# Patient Record
Sex: Female | Born: 1935 | Hispanic: No | Smoking: Never smoker
Health system: Southern US, Community
[De-identification: ages and names within clinical notes are randomized; demographics above are authoritative.]

## PROBLEM LIST (undated history)

## (undated) DIAGNOSIS — G459 Transient cerebral ischemic attack, unspecified: Secondary | ICD-10-CM

## (undated) DIAGNOSIS — H409 Unspecified glaucoma: Secondary | ICD-10-CM

## (undated) DIAGNOSIS — I639 Cerebral infarction, unspecified: Secondary | ICD-10-CM

## (undated) DIAGNOSIS — E785 Hyperlipidemia, unspecified: Secondary | ICD-10-CM

## (undated) DIAGNOSIS — M1990 Unspecified osteoarthritis, unspecified site: Secondary | ICD-10-CM

## (undated) DIAGNOSIS — I10 Essential (primary) hypertension: Secondary | ICD-10-CM

## (undated) HISTORY — PX: EYE SURGERY: SHX253

---

## 1998-11-16 ENCOUNTER — Other Ambulatory Visit: Admission: RE | Admit: 1998-11-16 | Discharge: 1998-11-16 | Payer: Self-pay | Admitting: Ophthalmology

## 2015-10-16 DIAGNOSIS — M8949 Other hypertrophic osteoarthropathy, multiple sites: Secondary | ICD-10-CM

## 2015-10-16 DIAGNOSIS — I10 Essential (primary) hypertension: Secondary | ICD-10-CM | POA: Insufficient documentation

## 2015-10-16 DIAGNOSIS — I208 Other forms of angina pectoris: Secondary | ICD-10-CM | POA: Insufficient documentation

## 2015-10-16 DIAGNOSIS — M159 Polyosteoarthritis, unspecified: Secondary | ICD-10-CM

## 2015-10-16 DIAGNOSIS — M1990 Unspecified osteoarthritis, unspecified site: Secondary | ICD-10-CM | POA: Insufficient documentation

## 2015-10-16 DIAGNOSIS — M150 Primary generalized (osteo)arthritis: Secondary | ICD-10-CM | POA: Insufficient documentation

## 2015-10-16 DIAGNOSIS — E785 Hyperlipidemia, unspecified: Secondary | ICD-10-CM | POA: Insufficient documentation

## 2015-10-16 DIAGNOSIS — G459 Transient cerebral ischemic attack, unspecified: Secondary | ICD-10-CM | POA: Insufficient documentation

## 2015-10-16 DIAGNOSIS — I249 Acute ischemic heart disease, unspecified: Secondary | ICD-10-CM | POA: Insufficient documentation

## 2015-10-16 DIAGNOSIS — D696 Thrombocytopenia, unspecified: Secondary | ICD-10-CM | POA: Insufficient documentation

## 2015-10-16 DIAGNOSIS — G458 Other transient cerebral ischemic attacks and related syndromes: Secondary | ICD-10-CM

## 2015-10-17 NOTE — Progress Notes (Signed)
Patient ID: Adriana Stevens, female   DOB: 1925/10/19, 82 y.o.   MRN: [id number]     Cardiology Office Note   Date:  10/18/2015   ID:  Adriana Stevens, DOB 1925-12-24, MRN [id number]  PCP:  Vidal Schwalbe, MD  Cardiologist:   Jenkins Rouge, MD   No chief complaint on file.     History of Present Illness: Adriana Stevens is a 82 y.o. female who presents for evaluation of angina.  In fact she has had no chest pain for over a year. Reviewed notes from [facility] She had muscular chest Pain after moving boxes. She was offerred a stress test but declined. Long standing HTN on good Rx  Activity limited by arthritis in knees. She lives with her oldest son and daughter in  [place] who is with her an looks after her well. Compliant with meds No chest pain, palpitations, dyspnea or syncope. No previously documented CAD no echo or stress test in past    PMH:  Arthritis Knees  HTN:  Elevated Lipids  Chest Pain: muscular  Past Surgical History  Procedure Laterality Date  . Eye surgery N/A      Current Outpatient Prescriptions  Medication Sig Dispense Refill  . amLODipine (NORVASC) 10 MG tablet Take 10 mg by mouth daily.    Marland Kitchen aspirin EC 81 MG tablet Take 81 mg by mouth daily.    Marland Kitchen atorvastatin (LIPITOR) 20 MG tablet Take 20 mg by mouth daily at 6 PM.    . cholecalciferol (VITAMIN D) 1000 units tablet Take 1,000 Units by mouth daily.    . hydrochlorothiazide (HYDRODIURIL) 25 MG tablet Take 25 mg by mouth daily.    Marland Kitchen lisinopril (PRINIVIL,ZESTRIL) 40 MG tablet Take 40 mg by mouth daily.    Marland Kitchen loteprednol (LOTEMAX) 0.2 % SUSP 1 drop 4 (four) times daily.    . nitroGLYCERIN (NITROSTAT) 0.4 MG SL tablet Place 0.4 mg under the tongue every 5 (five) minutes as needed for chest pain.    Marland Kitchen thiamine (VITAMIN B-1) 100 MG tablet Take 100 mg by mouth daily.    . traMADol (ULTRAM) 50 MG tablet Take by mouth every 6 (six) hours as needed.    . traZODone (DESYREL) 50 MG tablet Take 50 mg by mouth at bedtime as  needed for sleep.    . vitamin C (ASCORBIC ACID) 500 MG tablet Take 500 mg by mouth daily.     No current facility-administered medications for this visit.    Allergies:   Review of patient's allergies indicates no known allergies.    Social History:  The patient  reports that she has never smoked. She has never used smokeless tobacco. She reports that she does not drink alcohol or use illicit drugs.   Family History:  The patient's family history includes Arthritis in her sister; Cancer in her brother; Heart attack in her mother; Hypertension in her brother and sister; Stroke in her father.    ROS:  Please see the history of present illness.   Otherwise, review of systems are positive for none.   All other systems are reviewed and negative.    PHYSICAL EXAM: VS:  BP 118/70 mmHg  Pulse 63  Ht 5\' 5"  (1.651 m)  Wt 66.679 kg (147 lb)  BMI 24.46 kg/m2  SpO2 97% , BMI Body mass index is 24.46 kg/(m^2). Affect appropriate Healthy:  appears stated age 61: normal Neck supple with no adenopathy JVP normal no bruits no thyromegaly Lungs clear with no  wheezing and good diaphragmatic motion Heart:  S1/S2 no murmur, no rub, gallop or click PMI normal Abdomen: benighn, BS positve, no tenderness, no AAA no bruit.  No HSM or HJR Distal pulses intact with no bruits No edema Neuro non-focal Skin warm and dry No muscular weakness    EKG:  10/2014  SR biphasic T waves lateral leads  10/18/15  SR rate 62 nonspecific ST/T wave changes   Recent Labs: No results found for requested labs within last 365 days.    Lipid Panel No results found for: CHOL, TRIG, HDL, CHOLHDL, VLDL, LDLCALC, LDLDIRECT    Wt Readings from Last 3 Encounters:  10/18/15 66.679 kg (147 lb)      Other studies Reviewed: Additional studies/ records that were reviewed today include: Notes [facility] an dECG Primary care notes [facility] Dr Bartolo Darter.    ASSESSMENT AND PLAN:  1. Chest pain:  Over a  year ago non recurrent by history muscular no indication for stress testing 2. HTN:  Well controlled.  Continue current medications and low sodium Dash type diet.   3. Osteorthritis:  F/u ortho walks with cane NSAI's and injections as needed 4. Lipids :  On statin labs with primary  5. ECG:  Nonspecific ST changes no evidence of old MI or ischemia some changes reflective of HTN   Current medicines are reviewed at length with the patient today.  The patient does not have concerns regarding medicines.  The following changes have been made:  no change  Labs/ tests ordered today include: None   Orders Placed This Encounter  Procedures  . EKG 12-Lead     Disposition:   FU with me PRN      Signed, Jenkins Rouge, MD  10/18/2015 9:18 AM    [facility] [place], [place], [place]  [address] Phone: [phone]; Fax: [phone]

## 2015-10-18 ENCOUNTER — Encounter: Payer: Self-pay | Admitting: Cardiovascular Disease

## 2015-10-18 ENCOUNTER — Ambulatory Visit (INDEPENDENT_AMBULATORY_CARE_PROVIDER_SITE_OTHER): Payer: Medicare Other | Admitting: Cardiovascular Disease

## 2015-10-18 VITALS — BP 118/70 | HR 63 | Ht 65.0 in | Wt 147.0 lb

## 2015-10-18 DIAGNOSIS — I10 Essential (primary) hypertension: Secondary | ICD-10-CM | POA: Diagnosis not present

## 2015-10-18 NOTE — Patient Instructions (Signed)
Your physician recommends that you schedule a follow-up appointment in: As Needed  Your physician recommends that you continue on your current medications as directed. Please refer to the Current Medication list given to you today.  Thank you for choosing [facility]!    

## 2020-05-11 ENCOUNTER — Other Ambulatory Visit (HOSPITAL_COMMUNITY): Payer: Self-pay | Admitting: Adult Health Nurse Practitioner

## 2020-05-16 ENCOUNTER — Other Ambulatory Visit (HOSPITAL_COMMUNITY): Payer: Self-pay | Admitting: Adult Health Nurse Practitioner

## 2020-05-17 ENCOUNTER — Other Ambulatory Visit (HOSPITAL_COMMUNITY): Payer: Self-pay | Admitting: Adult Health Nurse Practitioner

## 2020-05-17 DIAGNOSIS — N63 Unspecified lump in unspecified breast: Secondary | ICD-10-CM

## 2020-06-06 ENCOUNTER — Ambulatory Visit (HOSPITAL_COMMUNITY)
Admission: RE | Admit: 2020-06-06 | Discharge: 2020-06-06 | Disposition: A | Discharge status: Home / Self Care | Payer: Medicare Other | Source: Ambulatory Visit | Attending: Adult Health Nurse Practitioner | Admitting: Adult Health Nurse Practitioner

## 2020-06-06 ENCOUNTER — Other Ambulatory Visit (HOSPITAL_COMMUNITY): Payer: Self-pay | Admitting: Adult Health Nurse Practitioner

## 2020-06-06 ENCOUNTER — Encounter (HOSPITAL_COMMUNITY): Payer: Self-pay | Admitting: Radiology

## 2020-06-06 DIAGNOSIS — N6310 Unspecified lump in the right breast, unspecified quadrant: Secondary | ICD-10-CM | POA: Diagnosis present

## 2020-06-06 DIAGNOSIS — N63 Unspecified lump in unspecified breast: Secondary | ICD-10-CM

## 2020-06-06 DIAGNOSIS — R921 Mammographic calcification found on diagnostic imaging of breast: Secondary | ICD-10-CM | POA: Diagnosis not present

## 2020-06-06 DIAGNOSIS — R928 Other abnormal and inconclusive findings on diagnostic imaging of breast: Secondary | ICD-10-CM

## 2020-06-06 DIAGNOSIS — N6315 Unspecified lump in the right breast, overlapping quadrants: Secondary | ICD-10-CM | POA: Insufficient documentation

## 2020-06-20 ENCOUNTER — Ambulatory Visit (HOSPITAL_COMMUNITY)
Admission: RE | Admit: 2020-06-20 | Discharge: 2020-06-20 | Disposition: A | Discharge status: Home / Self Care | Payer: Medicare Other | Source: Ambulatory Visit | Attending: Adult Health Nurse Practitioner | Admitting: Adult Health Nurse Practitioner

## 2020-06-20 ENCOUNTER — Other Ambulatory Visit (HOSPITAL_COMMUNITY): Payer: Self-pay | Admitting: Adult Health Nurse Practitioner

## 2020-06-20 ENCOUNTER — Other Ambulatory Visit: Payer: Self-pay

## 2020-06-20 DIAGNOSIS — R928 Other abnormal and inconclusive findings on diagnostic imaging of breast: Secondary | ICD-10-CM

## 2020-06-20 MED ORDER — LIDOCAINE HCL (PF) 2 % IJ SOLN
INTRAMUSCULAR | Status: AC
  Filled 2020-06-20: qty 10

## 2020-06-20 MED ORDER — LIDOCAINE-EPINEPHRINE (PF) 1 %-1:200000 IJ SOLN
INTRAMUSCULAR | Status: AC
  Filled 2020-06-20: qty 30

## 2020-06-20 MED ORDER — SODIUM BICARBONATE 4.2 % IV SOLN
INTRAVENOUS | Status: AC
  Filled 2020-06-20: qty 10

## 2020-06-22 LAB — SURGICAL PATHOLOGY

## 2020-06-28 ENCOUNTER — Telehealth: Payer: Self-pay | Admitting: Family Medicine

## 2020-06-28 ENCOUNTER — Encounter: Payer: Self-pay | Admitting: Family Medicine

## 2020-06-28 NOTE — Progress Notes (Signed)
This encounter was created in error - please disregard.

## 2020-06-28 NOTE — Telephone Encounter (Signed)
[place] and spoke to [place], [employer], she states to hold on she will go as the pt - Alisa cam back on phone and states that the pt stated to her that she did not want surgery at this time. Will send this note to inform our mammogram department of pt decision.

## 2020-07-31 ENCOUNTER — Encounter (HOSPITAL_COMMUNITY): Payer: Self-pay

## 2020-07-31 NOTE — Progress Notes (Signed)
Unable to complete introductory phone call as patient is a resident of [facility]. I will plan to meet with the patient during her initial visit with Dr. Delton Coombes

## 2020-08-01 ENCOUNTER — Inpatient Hospital Stay (HOSPITAL_COMMUNITY): Payer: Medicare Other | Attending: Hematology | Admitting: Hematology

## 2020-08-01 ENCOUNTER — Encounter (HOSPITAL_COMMUNITY): Payer: Self-pay

## 2020-08-01 ENCOUNTER — Other Ambulatory Visit: Payer: Self-pay

## 2020-08-01 ENCOUNTER — Encounter (HOSPITAL_COMMUNITY): Payer: Self-pay | Admitting: Hematology

## 2020-08-01 VITALS — BP 168/95 | HR 77 | Temp 96.9°F | Resp 16 | Ht 66.0 in | Wt 150.6 lb

## 2020-08-01 DIAGNOSIS — C50811 Malignant neoplasm of overlapping sites of right female breast: Secondary | ICD-10-CM

## 2020-08-01 DIAGNOSIS — Z800 Family history of malignant neoplasm of digestive organs: Secondary | ICD-10-CM | POA: Diagnosis not present

## 2020-08-01 DIAGNOSIS — C50911 Malignant neoplasm of unspecified site of right female breast: Secondary | ICD-10-CM | POA: Insufficient documentation

## 2020-08-01 DIAGNOSIS — Z803 Family history of malignant neoplasm of breast: Secondary | ICD-10-CM | POA: Diagnosis not present

## 2020-08-01 DIAGNOSIS — Z8049 Family history of malignant neoplasm of other genital organs: Secondary | ICD-10-CM | POA: Diagnosis not present

## 2020-08-01 DIAGNOSIS — Z79811 Long term (current) use of aromatase inhibitors: Secondary | ICD-10-CM | POA: Diagnosis not present

## 2020-08-01 DIAGNOSIS — Z170 Estrogen receptor positive status [ER+]: Secondary | ICD-10-CM | POA: Diagnosis not present

## 2020-08-01 DIAGNOSIS — C50211 Malignant neoplasm of upper-inner quadrant of right female breast: Secondary | ICD-10-CM

## 2020-08-01 MED ORDER — ANASTROZOLE 1 MG PO TABS: 1.0000 mg | ORAL_TABLET | Freq: Every day | ORAL | 6 refills | Status: DC

## 2020-08-01 NOTE — Progress Notes (Signed)
I met with the patient and her daughter today during the patient's initial visit with Dr. Delton Coombes. I introduced myself and explained my role in the patient's care. I provided my contact information and encouraged the patient or family to call with any questions or concerns.

## 2020-08-01 NOTE — Progress Notes (Signed)
[facility] [address] [place] [address]   Patient Care Team: Vidal Schwalbe, MD as PCP - General (Family Medicine) Brien Mates, RN as Oncology Nurse Navigator (Oncology)  CHIEF COMPLAINTS/PURPOSE OF CONSULTATION:  Newly diagnosed right breast cancer  HISTORY OF PRESENTING ILLNESS:  Adriana Stevens 87 y.o. female is here because of recent diagnosis of right breast cancer, at the request of Dr. Nelia Shi from [facility].  Today she is accompanied by her daughter and she reports feeling okay. She reports that the masses were discovered on mammogram. She is not able to ambulate and needs to use a wheelchair due to leg weakness after a fall years ago.  She lives in [place]. She needs help to dress herself, though she is able to feed herself, bath herself once she transfers into the shower, and is able to transfer herself from the wheelchair over to the commode in the bathroom. 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 She is a non-smoker. Her twin sister had cancer; another sister had breast cancer; a brother had some kind of cancer.  She does not want to pursue surgery and instead with do medical treatment. She declines to have genetic testing done.  I reviewed her records extensively and collaborated the history with the patient.  SUMMARY OF ONCOLOGIC HISTORY: Oncology History   No history exists.    In terms of breast cancer risk profile:  She menarched at early age of 50 and went to menopause at age mid-38's.  She had 6 pregnancies, her first child was born at age 72.  She never received birth control pills.  She was never exposed to fertility medications or hormone replacement therapy.  She has positive family history of Breast/GYN/GI cancer.  MEDICAL HISTORY:  No past medical history on file.  SURGICAL HISTORY: ** The histories are not reviewed yet. Please review them in the "History" navigator section  and refresh this [place].  SOCIAL HISTORY: Social History   Socioeconomic History  . Marital status: Unknown    Spouse name: Not on file  . Number of children: Not on file  . Years of education: Not on file  . Highest education level: Not on file  Occupational History  . Not on file  Tobacco Use  . Smoking status: Never Smoker  . Smokeless tobacco: Never Used  Substance and Sexual Activity  . Alcohol use: No    Alcohol/week: 0.0 standard drinks  . Drug use: No  . Sexual activity: Not on file  Other Topics Concern  . Not on file  Social History Narrative  . Not on file   Social Determinants of Health   Financial Resource Strain: Low Risk   . Difficulty of Paying Living Expenses: Not hard at all  Food Insecurity: No Food Insecurity  . Worried About Charity fundraiser in the Last Year: Never true  . Ran Out of Food in the Last Year: Never true  Transportation Needs: No Transportation Needs  . Lack of Transportation (Medical): No  . Lack of Transportation (Non-Medical): No  Physical Activity: Inactive  . Days of Exercise per Week: 0 days  . Minutes of Exercise per Session: 0 min  Stress: No Stress Concern Present  . Feeling of Stress : Not at all  Social Connections: Moderately Isolated  . Frequency of Communication with Friends and Family: More than three times a week  . Frequency of Social Gatherings  with Friends and Family: More than three times a week  . Attends Religious Services: 1 to 4 times per year  . Active Member of Clubs or Organizations: No  . Attends Archivist Meetings: Never  . Marital Status: Widowed  Intimate Partner Violence: Not At Risk  . Fear of Current or Ex-Partner: No  . Emotionally Abused: No  . Physically Abused: No  . Sexually Abused: No    FAMILY HISTORY: Family History  Problem Relation Age of Onset  . Heart attack Mother   . Stroke Father   . Arthritis Sister   . Hypertension Sister   . Cancer Brother   .  Hypertension Brother     ALLERGIES:  has No Known Allergies.  MEDICATIONS:  Current Outpatient Medications  Medication Sig Dispense Refill  . amLODipine (NORVASC) 10 MG tablet Take 10 mg by mouth daily.    Marland Kitchen anastrozole (ARIMIDEX) 1 MG tablet Take 1 tablet (1 mg total) by mouth daily. 30 tablet 6  . Ascorbic Acid 500 MG/15ML LIQD 1 tablet    . aspirin EC 81 MG tablet Take 81 mg by mouth daily.    Marland Kitchen atorvastatin (LIPITOR) 20 MG tablet Take 20 mg by mouth daily at 6 PM.    . cholecalciferol (VITAMIN D) 1000 units tablet Take 1,000 Units by mouth daily.    . diclofenac Sodium (VOLTAREN) 1 % GEL See admin instructions.    . famotidine (PEPCID) 20 MG tablet Take 20 mg by mouth 2 (two) times daily.    . hydrochlorothiazide (HYDRODIURIL) 25 MG tablet Take 25 mg by mouth daily.    Marland Kitchen latanoprost (XALATAN) 0.005 % ophthalmic solution SMARTSIG:In Eye(s)    . lisinopril (PRINIVIL,ZESTRIL) 40 MG tablet Take 40 mg by mouth daily.    Marland Kitchen loteprednol (LOTEMAX) 0.2 % SUSP 1 drop 4 (four) times daily.    . nitroGLYCERIN (NITROSTAT) 0.4 MG SL tablet Place 0.4 mg under the tongue every 5 (five) minutes as needed for chest pain.    Marland Kitchen thiamine (VITAMIN B-1) 100 MG tablet Take 100 mg by mouth daily.    . vitamin C (ASCORBIC ACID) 500 MG tablet Take 500 mg by mouth daily.    . traMADol (ULTRAM) 50 MG tablet Take by mouth every 6 (six) hours as needed. (Patient not taking: Reported on 08/01/2020)    . traZODone (DESYREL) 50 MG tablet Take 50 mg by mouth at bedtime as needed for sleep. (Patient not taking: Reported on 08/01/2020)     No current facility-administered medications for this visit.    REVIEW OF SYSTEMS:   Review of Systems  Constitutional: Positive for appetite change (75%) and fatigue (75%).  Musculoskeletal: Positive for arthralgias (8/10 L knee pain).  Neurological: Positive for numbness (R hand).  All other systems reviewed and are negative.   PHYSICAL EXAMINATION: ECOG PERFORMANCE STATUS: 3  - Symptomatic, >50% confined to bed  Vitals:   08/01/20 0822  BP: (!) 168/95  Pulse: 77  Resp: 16  Temp: (!) 96.9 F (36.1 C)  SpO2: 98%   Filed Weights   08/01/20 0822  Weight: 150 lb 9.6 oz (68.3 kg)   Physical Exam Vitals reviewed.  Constitutional:      Appearance: Normal appearance.     Comments: In wheelchair  Cardiovascular:     Rate and Rhythm: Normal rate and regular rhythm.     Pulses: Normal pulses.     Heart sounds: Normal heart sounds.  Pulmonary:     Effort: Pulmonary  effort is normal.     Breath sounds: Normal breath sounds.  Chest:  Breasts:     Right: Mass (2.5 cm freely mobile in 12 o'clock) present. No swelling, bleeding, inverted nipple, nipple discharge, skin change or tenderness.    Abdominal:     Palpations: Abdomen is soft. There is no mass.     Tenderness: There is no abdominal tenderness.  Musculoskeletal:     Right lower leg: No edema.     Left lower leg: No edema.  Neurological:     General: No focal deficit present.     Mental Status: She is alert and oriented to person, place, and time.  Psychiatric:        Mood and Affect: Mood normal.        Behavior: Behavior normal.      LABORATORY DATA:  I have reviewed the data as listed Recent Results (from the past 2160 hour(s))  Surgical pathology     Status: None   Collection Time: 06/20/20  8:44 AM  Result Value Ref Range   SURGICAL PATHOLOGY      SURGICAL PATHOLOGY ** THIS IS AN ADDENDUM REPORT ** CASE: [id number] PATIENT: Zara Chess Surgical Pathology Report **Addendum **  Reason for Addendum #1:  Breast Biomarker Results  Clinical History: mass   FINAL MICROSCOPIC DIAGNOSIS:  A. BREAST, 12:00, RIGHT, BIOPSY: - Invasive 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 calcifications. - See comment.  B. BREAST, 2:30, RIGHT, BIOPSY: - High-grade ductal carcinoma in situ with focal necrosis and calcifications. - Microscopic focus suspicious for invasive  ductal carcinoma. - See comment.  COMMENT: The greatest tumor dimension of invasive carcinoma in a single core is 0.9 cm. Breast prognostic profile will be performed.  Dr. Melina Copa agrees.  Call to the breast center of [facility] on 06/21/2020.  GROSS DESCRIPTION: Specimen A: In formalin labeled H[place]berd, Katrine and right breast 12:00 (TIF 0834 hours and CIT < than 1 minute) are 3 cores of gray-white to yellow,  soft to firm tissue which range from 0.9 x 0.2 x 0.2 cm to 1.5 x 0.2 x 0.2 cm, in toto 1 block.  Specimen B: In formalin labeled Pommier, Brynne and right breast 2:30 (TIF 0837 hours and CIT < than 1 minute) are 2 cores of gray-white to yellow, soft to firm tissue, 0.7 x 0.15 x 0.15 cm and 1.1 x 0.2 x 0.2 cm, in toto 1 block. SW 06/20/2020  Final Diagnosis performed by Claudette Laws, MD.   Electronically signed 06/21/2020 Technical component performed at [facility], [place] [address] [place], [place] [address].  Professional component performed at [employer]. [facility], [place] [address] [place], [place] [address].  Immunohistochemistry Technical component (if applicable) was performed at [facility]. [address] [place], [place], [place] [address].   IMMUNOHISTOCHEMISTRY DISCLAIMER (if applicable): Some of these immunohistochemical stains may have been developed and the performance characteristics determine by [facility]. Some may not have been cleared or approved by the U.S. Food and Drug Administration. The FDA has determined that such clearance or approval is not necessary. This test is used for clinical purposes. It should not be regarded as investigational or for research. This laboratory is certified under the [place] (CLIA-88) as qualified to perform high complexity clinical laboratory testing.  The controls stained appropriately.  ADDENDUM:  PROGNOSTIC  INDICATOR RESULTS:  Immunohistochemical and morphometric analysis performed manually  The tumor  cells are NEGATIVE for Her2 (0).  Estrogen Receptor: POSITIVE, 100% STRONG STAINING INTENSITY Progesterone Receptor: POSITIVE, 80% STRONG-MODERATE STAINING INTENSITY  Proliferation Marker Ki-67: 5%  Reference Range Estrogen and Progesterone Receptor      Negative  0%      Positive  >1%  All controls stained appropriately.    Addendum #1 performed by Thressa Sheller, MD.   Electronically signed 06/22/2020 Technical component performed at [facility], [place] [address] [place], [place] [address].  Professional component performed at [employer]. [facility], [place] [address] [place], [place] [address].  Immunohistochemistry Technical component (if applicable) was performed at [facility]. [address] [place], [place], [place] [address].   IMMUNOHISTOCHEMISTRY DISCLAIMER (if applicable): Some of these immunohistochemical stains may have been developed and the performance characteristics determine by [facility]. Some may not have been cleared or approved by the U.S. Food and Drug Administration. The FDA has determined that such clearance or approval is not necessary. This test is used for clinical purposes. It should not be regarded as investigational or for research. This laboratory is certified under the [place] (CLIA-88) as q ualified to perform high complexity clinical laboratory testing.  The controls stained appropriately.     RADIOGRAPHIC STUDIES: I have personally reviewed the radiological reports and agreed with the findings in the report. No results found.   ASSESSMENT:  1.  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  Additional  suspicious mass with calcifications in the inner right breast measuring up to 2.3 cm. 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 -She does not want to have any surgical intervention.  2.  Social/family history: -She lives at assisted living facility [facility]".  She uses wheelchair for ambulation and is able to dress herself and feed herself. 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  Never smoker. -Twin sister had cancer.  Brother had cancer.  Another sister had breast cancer.    PLAN:  1.  Stage I (T2 N0 M0) right breast IDC, ER/PR positive, HER-2 negative: -She does not want to have any kind of surgical intervention. -We talked about starting her on antiestrogen therapy with anastrozole with periodic monitoring. -We discussed the side effects of anastrozole in detail.  She agrees with proceeding with the treatment. -We have sent a prescription for anastrozole today. -We talked about her family history and the need for genetic testing.  She has 1 living daughter who is accompanying her today.  She declined any genetic testing at this time. -RTC 1 month for follow-up.  Will consider physical exam and reimaging in 3 months.  2.  Bone health: -Recommend taking calcium and vitamin D supplements. -Recommend baseline DEXA scan and checking vitamin D levels.  All questions were answered. The patient knows to call the clinic with any problems, questions or concerns.   Derek Jack, MD 08/01/20 9:47 AM  [place] [phone]   I, Milinda Antis, am acting as a scribe for Dr. Sanda Linger.  I, Derek Jack MD, have reviewed the above documentation for accuracy and completeness, and I agree with the above.

## 2020-08-01 NOTE — Patient Instructions (Addendum)
[place] at [facility] Discharge Instructions  You were seen today by Dr. Delton Coombes. He went over your recent results. Given that your breast cancer responds to estrogen, you will be prescribed Arimidex 1 mg to take once daily for 5 years to control the breast cancer. You will also be scheduled to have a DEXA scan to check the strength of your bones. Dr. Delton Coombes will see you back in 1 month for labs and follow up.   Thank you for choosing [place] at [facility] to provide your oncology and hematology care.  To afford each patient quality time with our provider, please arrive at least 15 minutes before your scheduled appointment time.   If you have a lab appointment with the [facility] please come in thru the Main Entrance and check in at the main information desk  You need to re-schedule your appointment should you arrive 10 or more minutes late.  We strive to give you quality time with our providers, and arriving late affects you and other patients whose appointments are after yours.  Also, if you no show three or more times for appointments you may be dismissed from the clinic at the providers discretion.     Again, thank you for choosing [facility].  Our hope is that these requests will decrease the amount of time that you wait before being seen by our physicians.       _____________________________________________________________  Should you have questions after your visit to [facility], please contact our office at [phone] between the hours of 8:00 a.m. and 4:30 p.m.  Voicemails left after 4:00 p.m. will not be returned until the following business day.  For prescription refill requests, have your pharmacy contact our office and allow 72 hours.    Cancer Center Support Programs:   > Cancer Support Group  2nd Tuesday of the month 1pm-2pm, Journey Room

## 2020-08-23 ENCOUNTER — Other Ambulatory Visit (HOSPITAL_COMMUNITY): Payer: Self-pay | Admitting: *Deleted

## 2020-08-29 ENCOUNTER — Telehealth (HOSPITAL_COMMUNITY): Payer: Self-pay

## 2020-08-29 ENCOUNTER — Encounter (HOSPITAL_COMMUNITY): Payer: Self-pay

## 2020-08-29 NOTE — Telephone Encounter (Signed)
This nurse received a call from [name] stating she anted to verify appointment for this patient.  This nurse advised of appointment times.  Also made aware of Dexa Scan that was scheduled for 08/31/20 at 10:30 and she was not to take any Calcium 24-48 hours prior.  She asked this nurse to call [facility] to remind them as well.  This nurse made a call and spoke with Alwyn Ren, the nurse providing care to this patient and reminded her to hold the Calcium and appointment times.  She acknowledged understanding.  No further questions or concerns at this time.

## 2020-08-31 ENCOUNTER — Other Ambulatory Visit: Payer: Self-pay

## 2020-08-31 ENCOUNTER — Inpatient Hospital Stay (HOSPITAL_BASED_OUTPATIENT_CLINIC_OR_DEPARTMENT_OTHER): Payer: Medicare Other | Admitting: Hematology

## 2020-08-31 ENCOUNTER — Inpatient Hospital Stay (HOSPITAL_COMMUNITY): Payer: Medicare Other | Attending: Hematology

## 2020-08-31 ENCOUNTER — Ambulatory Visit (HOSPITAL_COMMUNITY)
Admission: RE | Admit: 2020-08-31 | Discharge: 2020-08-31 | Disposition: A | Discharge status: Home / Self Care | Payer: Medicare Other | Source: Ambulatory Visit | Attending: Hematology | Admitting: Hematology

## 2020-08-31 VITALS — BP 172/88 | HR 75 | Temp 97.0°F | Resp 18 | Wt 165.8 lb

## 2020-08-31 DIAGNOSIS — Z800 Family history of malignant neoplasm of digestive organs: Secondary | ICD-10-CM | POA: Diagnosis not present

## 2020-08-31 DIAGNOSIS — Z8049 Family history of malignant neoplasm of other genital organs: Secondary | ICD-10-CM | POA: Insufficient documentation

## 2020-08-31 DIAGNOSIS — Z780 Asymptomatic menopausal state: Secondary | ICD-10-CM | POA: Diagnosis not present

## 2020-08-31 DIAGNOSIS — Z170 Estrogen receptor positive status [ER+]: Secondary | ICD-10-CM

## 2020-08-31 DIAGNOSIS — Z79811 Long term (current) use of aromatase inhibitors: Secondary | ICD-10-CM | POA: Diagnosis not present

## 2020-08-31 DIAGNOSIS — Z803 Family history of malignant neoplasm of breast: Secondary | ICD-10-CM | POA: Diagnosis not present

## 2020-08-31 DIAGNOSIS — M8589 Other specified disorders of bone density and structure, multiple sites: Secondary | ICD-10-CM | POA: Diagnosis not present

## 2020-08-31 DIAGNOSIS — Z13820 Encounter for screening for osteoporosis: Secondary | ICD-10-CM | POA: Insufficient documentation

## 2020-08-31 DIAGNOSIS — C50211 Malignant neoplasm of upper-inner quadrant of right female breast: Secondary | ICD-10-CM | POA: Insufficient documentation

## 2020-08-31 DIAGNOSIS — C50811 Malignant neoplasm of overlapping sites of right female breast: Secondary | ICD-10-CM | POA: Insufficient documentation

## 2020-08-31 LAB — CBC WITH DIFFERENTIAL/PLATELET
Abs Immature Granulocytes: 0.01 10*3/uL (ref 0.00–0.07)
Basophils Absolute: 0 10*3/uL (ref 0.0–0.1)
Basophils Relative: 0 %
Eosinophils Absolute: 0 10*3/uL (ref 0.0–0.5)
Eosinophils Relative: 1 %
HCT: 42.7 % (ref 36.0–46.0)
Hemoglobin: 13.9 g/dL (ref 12.0–15.0)
Immature Granulocytes: 0 %
Lymphocytes Relative: 31 %
Lymphs Abs: 2.1 10*3/uL (ref 0.7–4.0)
MCH: 31.6 pg (ref 26.0–34.0)
MCHC: 32.6 g/dL (ref 30.0–36.0)
MCV: 97 fL (ref 80.0–100.0)
Monocytes Absolute: 0.5 10*3/uL (ref 0.1–1.0)
Monocytes Relative: 7 %
Neutro Abs: 4 10*3/uL (ref 1.7–7.7)
Neutrophils Relative %: 61 %
Platelets: 195 10*3/uL (ref 150–400)
RBC: 4.4 MIL/uL (ref 3.87–5.11)
RDW: 13 % (ref 11.5–15.5)
WBC: 6.6 10*3/uL (ref 4.0–10.5)
nRBC: 0 % (ref 0.0–0.2)

## 2020-08-31 LAB — COMPREHENSIVE METABOLIC PANEL
ALT: 16 U/L (ref 0–44)
AST: 22 U/L (ref 15–41)
Albumin: 3.7 g/dL (ref 3.5–5.0)
Alkaline Phosphatase: 63 U/L (ref 38–126)
Anion gap: 7 (ref 5–15)
BUN: 14 mg/dL (ref 8–23)
CO2: 30 mmol/L (ref 22–32)
Calcium: 9.5 mg/dL (ref 8.9–10.3)
Chloride: 102 mmol/L (ref 98–111)
Creatinine, Ser: 0.88 mg/dL (ref 0.44–1.00)
GFR, Estimated: >60 mL/min (ref >60)
Glucose, Bld: 110 mg/dL — ABNORMAL HIGH (ref 70–99)
Potassium: 3.6 mmol/L (ref 3.5–5.1)
Sodium: 139 mmol/L (ref 135–145)
Total Bilirubin: 0.6 mg/dL (ref 0.3–1.2)
Total Protein: 7.1 g/dL (ref 6.5–8.1)

## 2020-08-31 LAB — VITAMIN D 25 HYDROXY (VIT D DEFICIENCY, FRACTURES): Vit D, 25-Hydroxy: 33.93 ng/mL (ref 30–100)

## 2020-08-31 NOTE — Progress Notes (Signed)
[facility] [address] [place] [address]   Patient Care Team: Vidal Schwalbe, MD as PCP - General (Family Medicine) Brien Mates, RN as Oncology Nurse Navigator (Oncology)  SUMMARY OF ONCOLOGIC HISTORY: Oncology History   No history exists.    CHIEF COMPLIANT: Follow-up for right breast cancer   INTERVAL HISTORY: Adriana Stevens is a 87 y.o. female here today for follow up of her right breast cancer. Her last visit was on 08/01/2020.   Today she is accompanied by her daughter and she reports feeling okay. She is taking Arimidex daily and denies having any breast pain, hot flashes or new bone aches. She continues having chronic hip pain. She continues taking calcium and vitamin D.   REVIEW OF SYSTEMS:   Review of Systems  Constitutional: Positive for fatigue (50%). Negative for appetite change.  Cardiovascular: Negative for chest pain.  Endocrine: Negative for hot flashes.  Musculoskeletal: Positive for arthralgias (4/10 hip pain).  All other systems reviewed and are negative.   I have reviewed the past medical history, past surgical history, social history and family history with the patient and they are unchanged from previous note.   ALLERGIES:   has No Known Allergies.   MEDICATIONS:  Current Outpatient Medications  Medication Sig Dispense Refill  . amLODipine (NORVASC) 10 MG tablet Take 10 mg by mouth daily.    Marland Kitchen anastrozole (ARIMIDEX) 1 MG tablet Take 1 tablet (1 mg total) by mouth daily. 30 tablet 6  . Ascorbic Acid 500 MG/15ML LIQD 1 tablet    . aspirin EC 81 MG tablet Take 81 mg by mouth daily.    Marland Kitchen atorvastatin (LIPITOR) 20 MG tablet Take 20 mg by mouth daily at 6 PM.    . cholecalciferol (VITAMIN D) 1000 units tablet Take 1,000 Units by mouth daily.    . diclofenac Sodium (VOLTAREN) 1 % GEL See admin instructions.    . famotidine (PEPCID) 20 MG tablet Take 20 mg by mouth 2 (two) times daily.    . hydrochlorothiazide  (HYDRODIURIL) 25 MG tablet Take 25 mg by mouth daily.    Marland Kitchen latanoprost (XALATAN) 0.005 % ophthalmic solution SMARTSIG:In Eye(s)    . lisinopril (PRINIVIL,ZESTRIL) 40 MG tablet Take 40 mg by mouth daily.    Marland Kitchen loteprednol (LOTEMAX) 0.5 % ophthalmic suspension SMARTSIG:In Eye(s)    . nitroGLYCERIN (NITROSTAT) 0.4 MG SL tablet Place 0.4 mg under the tongue every 5 (five) minutes as needed for chest pain.    Marland Kitchen olmesartan-hydrochlorothiazide (BENICAR HCT) 40-12.5 MG tablet Take 1 tablet by mouth daily.    . potassium chloride SA (KLOR-CON) 20 MEQ tablet Take 20 mEq by mouth daily.    Marland Kitchen thiamine (VITAMIN B-1) 100 MG tablet Take 100 mg by mouth daily.    . traMADol (ULTRAM) 50 MG tablet Take by mouth every 6 (six) hours as needed.    . traZODone (DESYREL) 50 MG tablet Take 50 mg by mouth at bedtime as needed for sleep.    . vitamin C (ASCORBIC ACID) 500 MG tablet Take 500 mg by mouth daily.     No current facility-administered medications for this visit.     PHYSICAL EXAMINATION: Performance status (ECOG): 3 - Symptomatic, >50% confined to bed  Vitals:   08/31/20 1304  BP: (!) 172/88  Pulse: 75  Resp: 18  Temp: (!) 97 F (36.1 C)  SpO2: 98%   Wt Readings from Last 3 Encounters:  08/31/20 165 lb 12.8 oz (  75.2 kg)  08/01/20 150 lb 9.6 oz (68.3 kg)  10/18/15 147 lb (66.7 kg)   Physical Exam Vitals reviewed.  Constitutional:      Appearance: Normal appearance.  Chest:  Breasts:     Right: Mass (1.5 cm @ 12 o'clock) present. No swelling, bleeding, inverted nipple, nipple discharge, skin change, tenderness, axillary adenopathy or supraclavicular adenopathy.     Left: No axillary adenopathy or supraclavicular adenopathy.    Lymphadenopathy:     Upper Body:     Right upper body: No supraclavicular, axillary or pectoral adenopathy.     Left upper body: No supraclavicular, axillary or pectoral adenopathy.  Neurological:     General: No focal deficit present.     Mental Status: She is  alert and oriented to person, place, and time.  Psychiatric:        Mood and Affect: Mood normal.        Behavior: Behavior normal.     Breast Exam Chaperone: Milinda Antis, MD     LABORATORY DATA:  I have reviewed the data as listed CMP Latest Ref Rng & Units 08/31/2020  Glucose 70 - 99 mg/dL 110(H)  BUN 8 - 23 mg/dL 14  Creatinine 0.44 - 1.00 mg/dL 0.88  Sodium 135 - 145 mmol/L 139  Potassium 3.5 - 5.1 mmol/L 3.6  Chloride 98 - 111 mmol/L 102  CO2 22 - 32 mmol/L 30  Calcium 8.9 - 10.3 mg/dL 9.5  Total Protein 6.5 - 8.1 g/dL 7.1  Total Bilirubin 0.3 - 1.2 mg/dL 0.6  Alkaline Phos 38 - 126 U/L 63  AST 15 - 41 U/L 22  ALT 0 - 44 U/L 16   No results found for: KYH062 Lab Results  Component Value Date   WBC 6.6 08/31/2020   HGB 13.9 08/31/2020   HCT 42.7 08/31/2020   MCV 97.0 08/31/2020   PLT 195 08/31/2020   NEUTROABS 4.0 08/31/2020  No results found for: VD25OH  ASSESSMENT:  1.  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  Additional suspicious mass with calcifications in the inner right breast measuring up to 2.3 cm. 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 -She does not want to have any surgical intervention. - Anastrozole started on 08/01/2020.  2.  Social/family history: -She lives at assisted living facility "[facility]".  She uses wheelchair for ambulation and is able to dress herself and feed herself. 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  Never smoker. -Twin sister had cancer.  Brother had cancer.  Another sister had breast cancer.  3.  Bone health: - DEXA scan on 08/31/2020 T score -1.8, osteopenia.   PLAN:  1.  Stage I (T2 N0 M0) right breast IDC, ER/PR positive, HER-2  negative: -She did not want any kind of surgical intervention. - Anastrozole was started on 08/01/2020. - She is tolerating it very well. - Physical examination today revealed slight decrease in the right breast mass at 12 o'clock position measuring a 1.5-2 cm. - Continue anastrozole.  Follow-up in 2 months with repeat right breast ultrasound along with labs. - CBC and LFTs today are within normal limits.  2.  Bone health: - Continue calcium and vitamin D supplements.  Vitamin D level is 33.9. - Reviewed DEXA scan from  08/31/2020 with T score -1.8, osteopenia.  Breast Cancer therapy associated bone loss: I have recommended calcium, Vitamin D and weight bearing exercises.  Orders placed this encounter:  No orders of the defined types were placed in this encounter.   The patient has a good understanding of the overall plan. She agrees with it. She will call with any problems that may develop before the next visit here.  Derek Jack, MD [facility] [phone]   I, Milinda Antis, am acting as a scribe for Dr. Sanda Linger.  I, Derek Jack MD, have reviewed the above documentation for accuracy and completeness, and I agree with the above.

## 2020-08-31 NOTE — Patient Instructions (Signed)
[facility] at [facility] Discharge Instructions  You were seen today by Dr. Delton Coombes. He went over your recent results and scans. You will be scheduled to have an ultrasound of your right breast before your next visit. Continue taking Arimidex daily for your breast cancer and calcium and vitamin D daily for your bone strength. Dr. Delton Coombes will see you back in 2 months for labs and follow up.   Thank you for choosing [place] at [facility] to provide your oncology and hematology care.  To afford each patient quality time with our provider, please arrive at least 15 minutes before your scheduled appointment time.   If you have a lab appointment with the [facility] please come in thru the Main Entrance and check in at the main information desk  You need to re-schedule your appointment should you arrive 10 or more minutes late.  We strive to give you quality time with our providers, and arriving late affects you and other patients whose appointments are after yours.  Also, if you no show three or more times for appointments you may be dismissed from the clinic at the providers discretion.     Again, thank you for choosing [facility].  Our hope is that these requests will decrease the amount of time that you wait before being seen by our physicians.       _____________________________________________________________  Should you have questions after your visit to [facility], please contact our office at [phone] between the hours of 8:00 a.m. and 4:30 p.m.  Voicemails left after 4:00 p.m. will not be returned until the following business day.  For prescription refill requests, have your pharmacy contact our office and allow 72 hours.    Cancer Center Support Programs:   > Cancer Support Group  2nd Tuesday of the month 1pm-2pm, Journey Room

## 2020-10-24 ENCOUNTER — Ambulatory Visit (HOSPITAL_COMMUNITY)
Admission: RE | Admit: 2020-10-24 | Discharge: 2020-10-24 | Disposition: A | Discharge status: Home / Self Care | Payer: Medicare Other | Source: Ambulatory Visit | Attending: Hematology | Admitting: Hematology

## 2020-10-24 DIAGNOSIS — C50211 Malignant neoplasm of upper-inner quadrant of right female breast: Secondary | ICD-10-CM | POA: Diagnosis not present

## 2020-10-24 DIAGNOSIS — Z170 Estrogen receptor positive status [ER+]: Secondary | ICD-10-CM | POA: Insufficient documentation

## 2020-11-02 ENCOUNTER — Other Ambulatory Visit: Payer: Self-pay

## 2020-11-02 ENCOUNTER — Inpatient Hospital Stay (HOSPITAL_COMMUNITY): Payer: Medicare Other | Attending: Hematology and Oncology | Admitting: Hematology and Oncology

## 2020-11-02 ENCOUNTER — Encounter (HOSPITAL_COMMUNITY): Payer: Self-pay | Admitting: Hematology and Oncology

## 2020-11-02 ENCOUNTER — Inpatient Hospital Stay (HOSPITAL_COMMUNITY): Payer: Medicare Other

## 2020-11-02 VITALS — BP 161/86 | HR 72 | Temp 96.7°F | Resp 18 | Wt 160.7 lb

## 2020-11-02 DIAGNOSIS — C50811 Malignant neoplasm of overlapping sites of right female breast: Secondary | ICD-10-CM | POA: Insufficient documentation

## 2020-11-02 DIAGNOSIS — M2550 Pain in unspecified joint: Secondary | ICD-10-CM | POA: Diagnosis not present

## 2020-11-02 DIAGNOSIS — M25561 Pain in right knee: Secondary | ICD-10-CM | POA: Diagnosis not present

## 2020-11-02 DIAGNOSIS — Z170 Estrogen receptor positive status [ER+]: Secondary | ICD-10-CM

## 2020-11-02 DIAGNOSIS — C50211 Malignant neoplasm of upper-inner quadrant of right female breast: Secondary | ICD-10-CM | POA: Diagnosis not present

## 2020-11-02 DIAGNOSIS — Z803 Family history of malignant neoplasm of breast: Secondary | ICD-10-CM | POA: Diagnosis not present

## 2020-11-02 DIAGNOSIS — M8580 Other specified disorders of bone density and structure, unspecified site: Secondary | ICD-10-CM | POA: Insufficient documentation

## 2020-11-02 DIAGNOSIS — R5383 Other fatigue: Secondary | ICD-10-CM | POA: Diagnosis not present

## 2020-11-02 DIAGNOSIS — Z79811 Long term (current) use of aromatase inhibitors: Secondary | ICD-10-CM | POA: Diagnosis not present

## 2020-11-02 DIAGNOSIS — Z79899 Other long term (current) drug therapy: Secondary | ICD-10-CM | POA: Insufficient documentation

## 2020-11-02 LAB — CBC WITH DIFFERENTIAL/PLATELET
Abs Immature Granulocytes: 0.03 10*3/uL (ref 0.00–0.07)
Basophils Absolute: 0 10*3/uL (ref 0.0–0.1)
Basophils Relative: 0 %
Eosinophils Absolute: 0.1 10*3/uL (ref 0.0–0.5)
Eosinophils Relative: 1 %
HCT: 43.6 % (ref 36.0–46.0)
Hemoglobin: 13.9 g/dL (ref 12.0–15.0)
Immature Granulocytes: 0 %
Lymphocytes Relative: 26 %
Lymphs Abs: 2.2 10*3/uL (ref 0.7–4.0)
MCH: 31.2 pg (ref 26.0–34.0)
MCHC: 31.9 g/dL (ref 30.0–36.0)
MCV: 98 fL (ref 80.0–100.0)
Monocytes Absolute: 0.4 10*3/uL (ref 0.1–1.0)
Monocytes Relative: 5 %
Neutro Abs: 5.7 10*3/uL (ref 1.7–7.7)
Neutrophils Relative %: 68 %
Platelets: 215 10*3/uL (ref 150–400)
RBC: 4.45 MIL/uL (ref 3.87–5.11)
RDW: 12.7 % (ref 11.5–15.5)
WBC: 8.5 10*3/uL (ref 4.0–10.5)
nRBC: 0 % (ref 0.0–0.2)

## 2020-11-02 LAB — COMPREHENSIVE METABOLIC PANEL
ALT: 17 U/L (ref 0–44)
AST: 22 U/L (ref 15–41)
Albumin: 4 g/dL (ref 3.5–5.0)
Alkaline Phosphatase: 64 U/L (ref 38–126)
Anion gap: 9 (ref 5–15)
BUN: 20 mg/dL (ref 8–23)
CO2: 28 mmol/L (ref 22–32)
Calcium: 9.4 mg/dL (ref 8.9–10.3)
Chloride: 99 mmol/L (ref 98–111)
Creatinine, Ser: 1.08 mg/dL — ABNORMAL HIGH (ref 0.44–1.00)
GFR, Estimated: 48 mL/min — ABNORMAL LOW (ref >60)
Glucose, Bld: 108 mg/dL — ABNORMAL HIGH (ref 70–99)
Potassium: 3.5 mmol/L (ref 3.5–5.1)
Sodium: 136 mmol/L (ref 135–145)
Total Bilirubin: 0.5 mg/dL (ref 0.3–1.2)
Total Protein: 7.4 g/dL (ref 6.5–8.1)

## 2020-11-02 LAB — VITAMIN D 25 HYDROXY (VIT D DEFICIENCY, FRACTURES): Vit D, 25-Hydroxy: 37.94 ng/mL (ref 30–100)

## 2020-11-02 NOTE — Progress Notes (Signed)
[name] [address] [place] [address]   Patient Care Team: Vidal Schwalbe, MD as PCP - General (Family Medicine) Brien Mates, RN as Oncology Nurse Navigator (Oncology)  SUMMARY OF ONCOLOGIC HISTORY: Oncology History   No history exists.    CHIEF COMPLIANT: Follow-up for right breast cancer   INTERVAL HISTORY: Ms. Adriana Stevens is a 87 y.o. female here today for follow up of her right breast cancer. Her last visit was on 08/01/2020.   She is doing well, complains of right knee pain which has gotten worse in the past few days. She is otherwise compliant with arimidex. She was wondering if we can give her extra pain meds. She has been taking tylenol 2 pills BID.  REVIEW OF SYSTEMS:   Review of Systems  Constitutional:  Positive for fatigue (50%). Negative for appetite change.  Cardiovascular:  Negative for chest pain.  Endocrine: Negative for hot flashes.  Musculoskeletal:  Positive for arthralgias (4/10 hip pain).  All other systems reviewed and are negative.  I have reviewed the past medical history, past surgical history, social history and family history with the patient and they are unchanged from previous note.   ALLERGIES:   has No Known Allergies.   MEDICATIONS:  Current Outpatient Medications  Medication Sig Dispense Refill   amLODipine (NORVASC) 10 MG tablet Take 10 mg by mouth daily.     anastrozole (ARIMIDEX) 1 MG tablet Take 1 tablet (1 mg total) by mouth daily. 30 tablet 6   Ascorbic Acid 500 MG/15ML LIQD 1 tablet     aspirin EC 81 MG tablet Take 81 mg by mouth daily.     atorvastatin (LIPITOR) 20 MG tablet Take 20 mg by mouth daily at 6 PM.     cholecalciferol (VITAMIN D) 1000 units tablet Take 1,000 Units by mouth daily.     diclofenac Sodium (VOLTAREN) 1 % GEL See admin instructions.     famotidine (PEPCID) 20 MG tablet Take 20 mg by mouth 2 (two) times daily.     hydrochlorothiazide (HYDRODIURIL) 25 MG tablet Take 25 mg  by mouth daily.     latanoprost (XALATAN) 0.005 % ophthalmic solution SMARTSIG:In Eye(s)     lisinopril (PRINIVIL,ZESTRIL) 40 MG tablet Take 40 mg by mouth daily.     loteprednol (LOTEMAX) 0.5 % ophthalmic suspension SMARTSIG:In Eye(s)     nitroGLYCERIN (NITROSTAT) 0.4 MG SL tablet Place 0.4 mg under the tongue every 5 (five) minutes as needed for chest pain.     olmesartan-hydrochlorothiazide (BENICAR HCT) 40-12.5 MG tablet Take 1 tablet by mouth daily.     potassium chloride SA (KLOR-CON) 20 MEQ tablet Take 20 mEq by mouth daily.     thiamine (VITAMIN B-1) 100 MG tablet Take 100 mg by mouth daily.     traMADol (ULTRAM) 50 MG tablet Take by mouth every 6 (six) hours as needed.     traZODone (DESYREL) 50 MG tablet Take 50 mg by mouth at bedtime as needed for sleep.     vitamin C (ASCORBIC ACID) 500 MG tablet Take 500 mg by mouth daily.     No current facility-administered medications for this visit.     PHYSICAL EXAMINATION: Performance status (ECOG): 3 - Symptomatic, >50% confined to bed  Vitals:   11/02/20 1416  BP: (!) 161/86  Pulse: 72  Resp: 18  Temp: (!) 96.7 F (35.9 C)  SpO2: 97%   Wt Readings from Last 3 Encounters:  11/02/20 160 lb  11.2 oz (72.9 kg)  08/31/20 165 lb 12.8 oz (75.2 kg)  08/01/20 150 lb 9.6 oz (68.3 kg)   Physical Exam Vitals reviewed.  Constitutional:      Appearance: Normal appearance.  Chest:  Breasts:    Right: Mass (1.5 cm @ 12 o'clock. No other new breast changes. No palpable lymphadenopathy. Left breast without any concerns.) present. No swelling, bleeding, inverted nipple, nipple discharge, skin change, tenderness, axillary adenopathy or supraclavicular adenopathy.     Left: No axillary adenopathy or supraclavicular adenopathy.  Lymphadenopathy:     Upper Body:     Right upper body: No supraclavicular, axillary or pectoral adenopathy.     Left upper body: No supraclavicular, axillary or pectoral adenopathy.  Neurological:     General: No  focal deficit present.     Mental Status: She is alert and oriented to person, place, and time.  Psychiatric:        Mood and Affect: Mood normal.        Behavior: Behavior normal.     LABORATORY DATA:  I have reviewed the data as listed CMP Latest Ref Rng & Units 11/02/2020 08/31/2020  Glucose 70 - 99 mg/dL 108(H) 110(H)  BUN 8 - 23 mg/dL 20 14  Creatinine 0.44 - 1.00 mg/dL 1.08(H) 0.88  Sodium 135 - 145 mmol/L 136 139  Potassium 3.5 - 5.1 mmol/L 3.5 3.6  Chloride 98 - 111 mmol/L 99 102  CO2 22 - 32 mmol/L 28 30  Calcium 8.9 - 10.3 mg/dL 9.4 9.5  Total Protein 6.5 - 8.1 g/dL 7.4 7.1  Total Bilirubin 0.3 - 1.2 mg/dL 0.5 0.6  Alkaline Phos 38 - 126 U/L 64 63  AST 15 - 41 U/L 22 22  ALT 0 - 44 U/L 17 16   No results found for: NTI144 Lab Results  Component Value Date   WBC 8.5 11/02/2020   HGB 13.9 11/02/2020   HCT 43.6 11/02/2020   MCV 98.0 11/02/2020   PLT 215 11/02/2020   NEUTROABS 5.7 11/02/2020   Lab Results  Component Value Date   VD25OH 33.93 08/31/2020    ASSESSMENT:  1.  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  Additional suspicious mass with calcifications in the inner right breast measuring up to 2.3 cm. 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 -She does not want to have any surgical intervention. - Anastrozole started on 08/01/2020.   2.  Social/family history: -She lives at assisted living facility "[facility]".  She uses wheelchair for ambulation and is able to dress herself and feed herself. 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  Never smoker. -Twin sister had cancer.  Brother had cancer.  Another sister had breast cancer.  3.  Bone health: - DEXA scan on 08/31/2020 T score  -1.8, osteopenia.   PLAN:  1.  Stage I (T2 N0 M0) right breast IDC, ER/PR positive, HER-2 negative: -She did not want any kind of surgical intervention. - Anastrozole was started on 08/01/2020. - She is tolerating it very well. - Physical examination today right breast mass at 12 0 clock position measuring under 2 cms - Continue anastrozole.  Follow-up in 2 months with Dr Raliegh Ip for follow up. - CBC and LFTs today are within normal limits.  2.  Bone health: - Continue calcium and vitamin D supplements.   - Reviewed DEXA scan from 08/31/2020 with T score -1.8, osteopenia.  Breast Cancer therapy associated bone loss: I have recommended calcium, Vitamin D and weight bearing exercises.  Orders placed this encounter:  No orders of the defined types were placed in this encounter.  Benay Pike MD

## 2020-11-03 ENCOUNTER — Other Ambulatory Visit (HOSPITAL_COMMUNITY): Payer: Self-pay

## 2020-11-03 DIAGNOSIS — Z170 Estrogen receptor positive status [ER+]: Secondary | ICD-10-CM

## 2020-12-27 ENCOUNTER — Non-Acute Institutional Stay: Payer: Medicare Other | Admitting: Primary Care

## 2020-12-27 ENCOUNTER — Other Ambulatory Visit: Payer: Self-pay

## 2020-12-27 DIAGNOSIS — R269 Unspecified abnormalities of gait and mobility: Secondary | ICD-10-CM | POA: Insufficient documentation

## 2020-12-27 DIAGNOSIS — M159 Polyosteoarthritis, unspecified: Secondary | ICD-10-CM

## 2020-12-27 DIAGNOSIS — I6990 Unspecified sequelae of unspecified cerebrovascular disease: Secondary | ICD-10-CM | POA: Insufficient documentation

## 2020-12-27 DIAGNOSIS — G459 Transient cerebral ischemic attack, unspecified: Secondary | ICD-10-CM

## 2020-12-27 DIAGNOSIS — I10 Essential (primary) hypertension: Secondary | ICD-10-CM

## 2020-12-27 DIAGNOSIS — Z515 Encounter for palliative care: Secondary | ICD-10-CM

## 2020-12-27 DIAGNOSIS — H5440 Blindness, one eye, unspecified eye: Secondary | ICD-10-CM | POA: Insufficient documentation

## 2020-12-27 DIAGNOSIS — G4700 Insomnia, unspecified: Secondary | ICD-10-CM | POA: Insufficient documentation

## 2020-12-27 DIAGNOSIS — E782 Mixed hyperlipidemia: Secondary | ICD-10-CM | POA: Insufficient documentation

## 2020-12-27 NOTE — Progress Notes (Signed)
[facility] Palliative Care Consult Note Telephone: [phone]  Fax: [phone]   Date of encounter: 12/27/20 12:42 PM PATIENT NAME: Adriana [address] [address]   [phone] (home)  DOB: July 08, 1925 MRN: [id number] PRIMARY CARE PROVIDER:    Vidal Schwalbe, MD,  [address] [place] [address] [phone]  REFERRING PROVIDER:   Areta Haber FNP [email or web address]  RESPONSIBLE PARTY:    Contact Information     Name Relation Home Work Mobile   [facility] Daughter [phone]         I met face to face with patient  in [facility] facility. Palliative Care was asked to follow this patient by consultation request of  Vidal Schwalbe, MD to address advance care planning and complex medical decision making. This is the initial visit.                                     ASSESSMENT AND PLAN / RECOMMENDATIONS:   Advance Care Planning/Goals of Care: Goals include to maximize quality of life and symptom management. Patient/health care surrogate gave his/her permission to discuss.Our advance care planning conversation included a discussion about:    Exploration of personal, cultural or spiritual beliefs that might influence medical decisions  Exploration of goals of care in the event of a sudden injury or illness  Identification of a healthcare agent - daughter [name] Review of an  advance directive document . Spoke with daughter [name] by phone, Explained palliative medicine concept and addition to the medical team. CODE STATUS: DNR  Symptom Management/Plan:  I met with patient in her room. She endorses being well and thankful for being alive. She endorses eating well and staff endorse constant weight. She does not eat much after 3 pm due to indigestion.   She endorses her breast cancer and that she's taking medication for it. She states she is able to get herself to bathroom in the w/c. She endorses hypertension and states  it has been running a bit high lately. She is aware of her issues and states overall she's feeling well. Daughter endorses she is to get nutritional supplements due to not wanting to eat after 3 pm.   I recommend mightly shake with breakfast or lunch, I recommend 6 pm med pass for evening meds as 8  pm exacerbates her GERD.  Follow up Palliative Care Visit: Palliative care will continue to follow for complex medical decision making, advance care planning, and clarification of goals. Return 4 weeks or prn.  I spent 45 minutes providing this consultation. More than 50% of the time in this consultation was spent in counseling and care coordination.  PPS: 40%  HOSPICE ELIGIBILITY/DIAGNOSIS: TBD  Chief Complaint: debility  HISTORY OF PRESENT ILLNESS:  Adriana Stevens is a 87 y.o. year old female  with breast cancer on AI therapy, h/o TIA, HTN. She has multiple joint pain from OA and immobility. Today's visit is made to establish with PC and assess current medical management needs.  History obtained from review of EMR, discussion with primary team, and interview with family, facility staff/caregiver and/or Ms. Peltzer.  I reviewed available labs, medications, imaging, studies and related documents from the EMR.  Records reviewed and summarized above.   ROS  General: NAD EYES: denies vision changes, blind in R eye ENMT: denies dysphagia Cardiovascular: denies chest pain, denies DOE Pulmonary: denies cough,  denies increased SOB Abdomen: endorses good appetite, denies constipation, endorses continence of bowel GU: denies dysuria, endorses continence of urine MSK:  endorses mild weakness,  no falls reported Skin: denies rashes or wounds Neurological: endorses joint pain, denies insomnia Psych: Endorses positive mood Heme/lymph/immuno: denies bruises, abnormal bleeding                Physical Exam: Current and past weights: 159 lbs Constitutional: NAD, 100/79 HR 66 RR 18 General: frail  appearing, WNWD  EYES: anicteric sclera, lids intact, no discharge  ENMT: intact hearing, oral mucous membranes moist CV: RRR, 1+  LE edema Pulmonary: no increased work of breathing, no cough, room air Abdomen: intake 75%, no ascites GU: deferred MSK: mod sarcopenia, moves all extremities, non ambulatory Skin: warm and dry, no rashes or wounds on visible skin Neuro:  + generalized weakness,  ++ cognitive impairment Psych: non-anxious affect, A and O x 1-2 Hem/lymph/immuno: no widespread bruising CURRENT PROBLEM LIST:  Patient Active Problem List   Diagnosis Date Noted   Abnormal gait 12/27/2020   Insomnia 12/27/2020   Mixed hyperlipidemia 12/27/2020   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   Blindness of right eye 12/27/2020   Breast cancer, right ([place]) 08/01/2020   Acute ischemic heart disease ([place]) 10/16/2015   Essential hypertension 10/16/2015   Dyslipidemia 10/16/2015   Primary generalized (osteo)arthritis 10/16/2015   TIA (transient ischemic attack) 10/16/2015   Thrombocytopenia ([place]) 10/16/2015     PAST MEDICAL HISTORY:  Patient Active Problem List   Diagnosis Date Noted   Abnormal gait 12/27/2020   Insomnia 12/27/2020   Mixed hyperlipidemia 12/27/2020   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   Blindness of right eye 12/27/2020   Breast cancer, right ([place]) 08/01/2020   Acute ischemic heart disease ([place]) 10/16/2015   Essential hypertension 10/16/2015   Dyslipidemia 10/16/2015   Primary generalized (osteo)arthritis 10/16/2015   TIA (transient ischemic attack) 10/16/2015   Thrombocytopenia ([place]) 10/16/2015     SOCIAL HX:  Social History   Tobacco Use   Smoking status: Never   Smokeless tobacco: Never  Substance Use Topics   Alcohol use: No    Alcohol/week: 0.0 standard drinks   FAMILY HX:  Family History  Problem Relation Age of Onset   Heart attack Mother    Stroke Father    Arthritis Sister    Hypertension Sister    Cancer Brother     Hypertension Brother       ALLERGIES: No Known Allergies   PERTINENT MEDICATIONS:  Outpatient Encounter Medications as of 12/27/2020  Medication Sig   acetaminophen (TYLENOL) 500 MG tablet Take 1,000 mg by mouth 3 (three) times daily.   alum & mag hydroxide-simeth (MAALOX/MYLANTA) 200-200-20 MG/5ML suspension Take 30 mLs by mouth every 6 (six) hours as needed for indigestion or heartburn.   Amino Acids-Protein Hydrolys (FEEDING SUPPLEMENT, PRO-STAT 64,) LIQD Take 30 mLs by mouth daily.   amLODipine (NORVASC) 10 MG tablet Take 10 mg by mouth daily.   anastrozole (ARIMIDEX) 1 MG tablet Take 1 tablet (1 mg total) by mouth daily.   diclofenac Sodium (VOLTAREN) 1 % GEL See admin instructions.   docusate sodium (COLACE) 100 MG capsule Take 100 mg by mouth daily.   famotidine (PEPCID) 20 MG tablet Take 20 mg by mouth 2 (two) times daily.   gabapentin (NEURONTIN) 100 MG capsule Take 100 mg by mouth at bedtime.   hydroxypropyl methylcellulose / hypromellose (ISOPTO TEARS / GONIOVISC) 2.5 % ophthalmic solution 1 drop 4 (four) times  daily.   latanoprost (XALATAN) 0.005 % ophthalmic solution SMARTSIG:In Eye(s)   loteprednol (LOTEMAX) 0.5 % ophthalmic suspension SMARTSIG:In Eye(s)   Magnesium 400 MG TABS Take 1 tablet by mouth at bedtime.   magnesium hydroxide (MILK OF MAGNESIA) 400 MG/5ML suspension Take 30 mLs by mouth daily as needed for mild constipation.   melatonin 3 MG TABS tablet Take 3 mg by mouth at bedtime.   olmesartan-hydrochlorothiazide (BENICAR HCT) 40-12.5 MG tablet Take 1 tablet by mouth daily.   potassium chloride SA (KLOR-CON) 20 MEQ tablet Take 20 mEq by mouth daily.   senna (SENOKOT) 8.6 MG TABS tablet Take 2 tablets by mouth at bedtime.   therapeutic multivitamin-minerals (THERAGRAN-M) tablet Take 1 tablet by mouth daily.   traZODone (DESYREL) 50 MG tablet Take 50 mg by mouth at bedtime as needed for sleep.   Ascorbic Acid 500 MG/15ML LIQD 1 tablet   nitroGLYCERIN (NITROSTAT)  0.4 MG SL tablet Place 0.4 mg under the tongue every 5 (five) minutes as needed for chest pain.   [DISCONTINUED] aspirin EC 81 MG tablet Take 81 mg by mouth daily.   [DISCONTINUED] atorvastatin (LIPITOR) 20 MG tablet Take 20 mg by mouth daily at 6 PM.   [DISCONTINUED] cholecalciferol (VITAMIN D) 1000 units tablet Take 1,000 Units by mouth daily.   [DISCONTINUED] hydrochlorothiazide (HYDRODIURIL) 25 MG tablet Take 25 mg by mouth daily.   [DISCONTINUED] lisinopril (PRINIVIL,ZESTRIL) 40 MG tablet Take 40 mg by mouth daily.   [DISCONTINUED] thiamine (VITAMIN B-1) 100 MG tablet Take 100 mg by mouth daily.   [DISCONTINUED] traMADol (ULTRAM) 50 MG tablet Take by mouth every 6 (six) hours as needed.   [DISCONTINUED] vitamin C (ASCORBIC ACID) 500 MG tablet Take 500 mg by mouth daily.   No facility-administered encounter medications on file as of 12/27/2020.     Thank you for the opportunity to participate in the care of Adriana Stevens.  The palliative care team will continue to follow. Please call our office at [phone] if we can be of additional assistance.   Jason Coop, NP ,   COVID-19 PATIENT SCREENING TOOL Asked and negative response unless otherwise noted:  Have you had symptoms of covid, tested positive or been in contact with someone with symptoms/positive test in the past 5-10 days?

## 2021-01-09 ENCOUNTER — Inpatient Hospital Stay (HOSPITAL_COMMUNITY): Payer: Medicare Other | Attending: Hematology

## 2021-01-09 ENCOUNTER — Other Ambulatory Visit: Payer: Self-pay

## 2021-01-09 DIAGNOSIS — I10 Essential (primary) hypertension: Secondary | ICD-10-CM | POA: Insufficient documentation

## 2021-01-09 DIAGNOSIS — M25561 Pain in right knee: Secondary | ICD-10-CM | POA: Diagnosis not present

## 2021-01-09 DIAGNOSIS — E871 Hypo-osmolality and hyponatremia: Secondary | ICD-10-CM | POA: Diagnosis not present

## 2021-01-09 DIAGNOSIS — Z79811 Long term (current) use of aromatase inhibitors: Secondary | ICD-10-CM | POA: Diagnosis not present

## 2021-01-09 DIAGNOSIS — C50811 Malignant neoplasm of overlapping sites of right female breast: Secondary | ICD-10-CM | POA: Insufficient documentation

## 2021-01-09 DIAGNOSIS — Z79899 Other long term (current) drug therapy: Secondary | ICD-10-CM | POA: Insufficient documentation

## 2021-01-09 DIAGNOSIS — Z170 Estrogen receptor positive status [ER+]: Secondary | ICD-10-CM | POA: Insufficient documentation

## 2021-01-09 DIAGNOSIS — M2550 Pain in unspecified joint: Secondary | ICD-10-CM | POA: Diagnosis not present

## 2021-01-09 DIAGNOSIS — M8580 Other specified disorders of bone density and structure, unspecified site: Secondary | ICD-10-CM | POA: Insufficient documentation

## 2021-01-09 DIAGNOSIS — Z803 Family history of malignant neoplasm of breast: Secondary | ICD-10-CM | POA: Insufficient documentation

## 2021-01-09 LAB — CBC WITH DIFFERENTIAL/PLATELET
Abs Immature Granulocytes: 0.04 K/uL (ref 0.00–0.07)
Basophils Absolute: 0 K/uL (ref 0.0–0.1)
Basophils Relative: 0 %
Eosinophils Absolute: 0 K/uL (ref 0.0–0.5)
Eosinophils Relative: 0 %
HCT: 42.1 % (ref 36.0–46.0)
Hemoglobin: 13.7 g/dL (ref 12.0–15.0)
Immature Granulocytes: 1 %
Lymphocytes Relative: 37 %
Lymphs Abs: 2.1 K/uL (ref 0.7–4.0)
MCH: 31.9 pg (ref 26.0–34.0)
MCHC: 32.5 g/dL (ref 30.0–36.0)
MCV: 98.1 fL (ref 80.0–100.0)
Monocytes Absolute: 0.4 K/uL (ref 0.1–1.0)
Monocytes Relative: 7 %
Neutro Abs: 3.1 K/uL (ref 1.7–7.7)
Neutrophils Relative %: 55 %
Platelets: 192 K/uL (ref 150–400)
RBC: 4.29 MIL/uL (ref 3.87–5.11)
RDW: 11.8 % (ref 11.5–15.5)
WBC: 5.6 K/uL (ref 4.0–10.5)
nRBC: 0 % (ref 0.0–0.2)

## 2021-01-09 LAB — COMPREHENSIVE METABOLIC PANEL
ALT: 18 U/L (ref 0–44)
AST: 23 U/L (ref 15–41)
Albumin: 3.8 g/dL (ref 3.5–5.0)
Alkaline Phosphatase: 67 U/L (ref 38–126)
Anion gap: 9 (ref 5–15)
BUN: 19 mg/dL (ref 8–23)
CO2: 27 mmol/L (ref 22–32)
Calcium: 8.8 mg/dL — ABNORMAL LOW (ref 8.9–10.3)
Chloride: 92 mmol/L — ABNORMAL LOW (ref 98–111)
Creatinine, Ser: 1.04 mg/dL — ABNORMAL HIGH (ref 0.44–1.00)
GFR, Estimated: 50 mL/min — ABNORMAL LOW (ref >60)
Glucose, Bld: 118 mg/dL — ABNORMAL HIGH (ref 70–99)
Potassium: 3.8 mmol/L (ref 3.5–5.1)
Sodium: 128 mmol/L — ABNORMAL LOW (ref 135–145)
Total Bilirubin: 0.5 mg/dL (ref 0.3–1.2)
Total Protein: 7.3 g/dL (ref 6.5–8.1)

## 2021-01-09 LAB — VITAMIN D 25 HYDROXY (VIT D DEFICIENCY, FRACTURES): Vit D, 25-Hydroxy: 31.72 ng/mL (ref 30–100)

## 2021-01-15 NOTE — Progress Notes (Signed)
 Adriana Stevens [address] [place], [place] [address]   Patient Care Team: Kikel, Stephen, MD as PCP - General (Family Medicine) Edwards, Morgan P, RN as Oncology Nurse Navigator (Oncology) Smith, Kathryn McKelvey, NP as Nurse Practitioner (Hospice and Palliative Medicine) Martinez, Rebecca Diana, NP as Referring Physician (Adult Health Nurse Practitioner)   CHIEF COMPLIANT: Follow-up for right breast cancer   INTERVAL HISTORY: Adriana Stevens is a 87 y.o. female here today for follow up of her right breast cancer. Her last visit was on 08/31/2020.   Today Adriana Stevens report feeling good. Adriana Stevens denies hot flashes and new pains. Adriana Stevens reports stable pain in her right knee due to arthritis.   REVIEW OF SYSTEMS:   Review of Systems  Constitutional:  Negative for appetite change and fatigue (75%).  Genitourinary:  Positive for frequency.   Musculoskeletal:  Positive for arthralgias (R knee).  All other systems reviewed and are negative.  I have reviewed the past medical history, past surgical history, social history and family history with the patient and they are unchanged from previous note.   ALLERGIES:   has No Known Allergies.   MEDICATIONS:  Current Outpatient Medications  Medication Sig Dispense Refill   acetaminophen (TYLENOL) 500 MG tablet Take 1,000 mg by mouth 3 (three) times daily.     alum & mag hydroxide-simeth (MAALOX/MYLANTA) 200-200-20 MG/5ML suspension Take 30 mLs by mouth every 6 (six) hours as needed for indigestion or heartburn. (Patient not taking: Reported on 01/16/2021)     Amino Acids-Protein Hydrolys (FEEDING SUPPLEMENT, PRO-STAT 64,) LIQD Take 30 mLs by mouth daily.     amLODipine (NORVASC) 10 MG tablet Take 10 mg by mouth daily.     anastrozole (ARIMIDEX) 1 MG tablet Take 1 tablet (1 mg total) by mouth daily. 30 tablet 6   Ascorbic Acid 500 MG/15ML LIQD 1 tablet     diclofenac Sodium (VOLTAREN) 1 % GEL See admin instructions.     docusate sodium  (COLACE) 100 MG capsule Take 100 mg by mouth daily.     famotidine (PEPCID) 20 MG tablet Take 20 mg by mouth 2 (two) times daily.     gabapentin (NEURONTIN) 100 MG capsule Take 100 mg by mouth at bedtime.     hydroxypropyl methylcellulose / hypromellose (ISOPTO TEARS / GONIOVISC) 2.5 % ophthalmic solution 1 drop 4 (four) times daily.     latanoprost (XALATAN) 0.005 % ophthalmic solution SMARTSIG:In Eye(s)     loteprednol (LOTEMAX) 0.5 % ophthalmic suspension SMARTSIG:In Eye(s)     Magnesium 400 MG TABS Take 1 tablet by mouth at bedtime.     magnesium hydroxide (MILK OF MAGNESIA) 400 MG/5ML suspension Take 30 mLs by mouth daily as needed for mild constipation. (Patient not taking: Reported on 01/16/2021)     melatonin 3 MG TABS tablet Take 3 mg by mouth at bedtime.     nitroGLYCERIN (NITROSTAT) 0.4 MG SL tablet Place 0.4 mg under the tongue every 5 (five) minutes as needed for chest pain. (Patient not taking: Reported on 01/16/2021)     olmesartan-hydrochlorothiazide (BENICAR HCT) 40-12.5 MG tablet Take 1 tablet by mouth daily.     potassium chloride SA (KLOR-CON) 20 MEQ tablet Take 20 mEq by mouth daily.     senna (SENOKOT) 8.6 MG TABS tablet Take 2 tablets by mouth at bedtime.     therapeutic multivitamin-minerals (THERAGRAN-M) tablet Take 1 tablet by mouth daily.     traZODone (DESYREL) 50 MG tablet Take 50 mg by   mouth at bedtime as needed for sleep. (Patient not taking: Reported on 01/16/2021)     No current facility-administered medications for this visit.     PHYSICAL EXAMINATION: Performance status (ECOG): 3 - Symptomatic, >50% confined to bed  Vitals:   01/16/21 1444  BP: 137/82  Pulse: 81  Resp: 17  Temp: (!) 96.7 F (35.9 C)  SpO2: 99%   Wt Readings from Last 3 Encounters:  01/16/21 159 lb 14.4 oz (72.5 kg)  11/02/20 160 lb 11.2 oz (72.9 kg)  08/31/20 165 lb 12.8 oz (75.2 kg)   Physical Exam Vitals reviewed.  Constitutional:      Appearance: Normal appearance.      Comments: In wheelchair  Cardiovascular:     Rate and Rhythm: Normal rate and regular rhythm.     Pulses: Normal pulses.     Heart sounds: Normal heart sounds.  Pulmonary:     Effort: Pulmonary effort is normal.     Breath sounds: Normal breath sounds.  Chest:  Breasts:    Right: Mass (12:00 position freely mobile 2 cm) present.     Left: Normal.  Neurological:     General: No focal deficit present.     Mental Status: Adriana Stevens is alert and oriented to person, place, and time.  Psychiatric:        Mood and Affect: Mood normal.        Behavior: Behavior normal.    Breast Exam Chaperone: Kirstyn Evans     LABORATORY DATA:  I have reviewed the data as listed CMP Latest Ref Rng & Units 01/09/2021 11/02/2020 08/31/2020  Glucose 70 - 99 mg/dL 118(H) 108(H) 110(H)  BUN 8 - 23 mg/dL 19 20 14  Creatinine 0.44 - 1.00 mg/dL 1.04(H) 1.08(H) 0.88  Sodium 135 - 145 mmol/L 128(L) 136 139  Potassium 3.5 - 5.1 mmol/L 3.8 3.5 3.6  Chloride 98 - 111 mmol/L 92(L) 99 102  CO2 22 - 32 mmol/L 27 28 30  Calcium 8.9 - 10.3 mg/dL 8.8(L) 9.4 9.5  Total Protein 6.5 - 8.1 g/dL 7.3 7.4 7.1  Total Bilirubin 0.3 - 1.2 mg/dL 0.5 0.5 0.6  Alkaline Phos 38 - 126 U/L 67 64 63  AST 15 - 41 U/L 23 22 22  ALT 0 - 44 U/L 18 17 16   No results found for: CAN153 Lab Results  Component Value Date   WBC 5.6 01/09/2021   HGB 13.7 01/09/2021   HCT 42.1 01/09/2021   MCV 98.1 01/09/2021   PLT 192 01/09/2021   NEUTROABS 3.1 01/09/2021    ASSESSMENT:  1.  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  Additional suspicious mass with calcifications in the inner right breast measuring up to 2.3 cm. 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 -Adriana Stevens does not  want to have any surgical intervention. - Anastrozole started on 08/01/2020.   2.  Social/family history: -Adriana Stevens lives at assisted living facility [facility]".  Adriana Stevens uses wheelchair for ambulation and is able to dress herself and feed herself. 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  Never smoker. -Twin sister had cancer.  Brother had cancer.  Another sister had breast cancer.   3.  Bone health: - DEXA scan on 08/31/2020 T score -1.8, osteopenia.  PLAN:    1.  Stage I (T2 N0 M0) right breast IDC, ER/PR positive, HER-2 negative: -Adriana Stevens reports tolerating anastrozole reasonably well. - Physical examination today revealed 2 cm mass at 12 o'clock position in the right breast.  No palpable adenopathy in the axillary or supraclavicular region. - Last right breast ultrasound on 10/24/2020 showed dominant mass in the right breast at 12 o'clock position measuring 2.6 x 0.7 x 0.9 cm, minimally smaller.  Additional mass in the right breast at 2:30 position is also slightly smaller measuring 0.7 x 0.4 x 0.6 cm. - Recommend follow-up in 2 months with a repeat right breast ultrasound.   2.  Bone health: -DEXA scan from 08/31/2020 with T score -1.8 consistent with osteopenia. - Continue calcium and vitamin D supplements.  Vitamin D level is 31.  3.  Hyponatremia: - Labs on 01/09/2021 with sodium 128.  Prior sodium was 136 in June. - Creatinine has also slightly increased to 1.04, from baseline 0.8. - Reviewed her medications.  Adriana Stevens is on benazepril/HCTZ 40/25.  HCTZ combo and could contribute to hyponatremia.  Recommend holding and consider alternative treatment for hypertension.  Adriana Stevens is also on amlodipine 10 mg daily.  Breast Cancer therapy associated bone loss: I have recommended calcium, Vitamin D and weight bearing exercises.  Orders placed this encounter:  No orders of the defined types were placed in this encounter.   The patient has a good understanding of the overall plan. Adriana Stevens agrees with it. Adriana Stevens  will call with any problems that may develop before the next visit here.  Derek Jack, MD [place] [phone]   I, Thana Ates, am acting as a scribe for Dr. Derek Jack.  I, Derek Jack MD, have reviewed the above documentation for accuracy and completeness, and I agree with the above.

## 2021-01-16 ENCOUNTER — Other Ambulatory Visit (HOSPITAL_COMMUNITY): Payer: Self-pay | Admitting: Hematology

## 2021-01-16 ENCOUNTER — Inpatient Hospital Stay (HOSPITAL_BASED_OUTPATIENT_CLINIC_OR_DEPARTMENT_OTHER): Payer: Medicare Other | Admitting: Hematology

## 2021-01-16 ENCOUNTER — Other Ambulatory Visit: Payer: Self-pay

## 2021-01-16 VITALS — BP 137/82 | HR 81 | Temp 96.7°F | Resp 17 | Wt 159.9 lb

## 2021-01-16 DIAGNOSIS — R928 Other abnormal and inconclusive findings on diagnostic imaging of breast: Secondary | ICD-10-CM

## 2021-01-16 DIAGNOSIS — C50811 Malignant neoplasm of overlapping sites of right female breast: Secondary | ICD-10-CM | POA: Diagnosis not present

## 2021-01-16 DIAGNOSIS — C50211 Malignant neoplasm of upper-inner quadrant of right female breast: Secondary | ICD-10-CM

## 2021-01-16 DIAGNOSIS — Z170 Estrogen receptor positive status [ER+]: Secondary | ICD-10-CM | POA: Diagnosis not present

## 2021-01-16 NOTE — Patient Instructions (Addendum)
[place] at [facility] Discharge Instructions  You were seen today by Dr. Delton Coombes. He went over your recent results. You will be scheduled for an ultrasound of your right breast prior to your next visit. Dr. Delton Coombes will see you back in 2 months for labs and follow up.   Thank you for choosing [place] at [facility] to provide your oncology and hematology care.  To afford each patient quality time with our provider, please arrive at least 15 minutes before your scheduled appointment time.   If you have a lab appointment with the [facility] please come in thru the Main Entrance and check in at the main information desk  You need to re-schedule your appointment should you arrive 10 or more minutes late.  We strive to give you quality time with our providers, and arriving late affects you and other patients whose appointments are after yours.  Also, if you no show three or more times for appointments you may be dismissed from the clinic at the providers discretion.     Again, thank you for choosing [facility].  Our hope is that these requests will decrease the amount of time that you wait before being seen by our physicians.       _____________________________________________________________  Should you have questions after your visit to [facility], please contact our office at [phone] between the hours of 8:00 a.m. and 4:30 p.m.  Voicemails left after 4:00 p.m. will not be returned until the following business day.  For prescription refill requests, have your pharmacy contact our office and allow 72 hours.    Cancer Center Support Programs:   > Cancer Support Group  2nd Tuesday of the month 1pm-2pm, Journey Room

## 2021-01-24 ENCOUNTER — Encounter (HOSPITAL_COMMUNITY): Payer: Medicare Other

## 2021-01-24 ENCOUNTER — Ambulatory Visit (HOSPITAL_COMMUNITY): Payer: Medicare Other

## 2021-02-13 ENCOUNTER — Ambulatory Visit (HOSPITAL_COMMUNITY)
Admission: RE | Admit: 2021-02-13 | Discharge: 2021-02-13 | Disposition: A | Discharge status: Home / Self Care | Payer: Medicare Other | Source: Ambulatory Visit | Attending: Hematology | Admitting: Hematology

## 2021-02-13 ENCOUNTER — Other Ambulatory Visit: Payer: Self-pay

## 2021-02-13 DIAGNOSIS — Z170 Estrogen receptor positive status [ER+]: Secondary | ICD-10-CM | POA: Insufficient documentation

## 2021-02-13 DIAGNOSIS — R928 Other abnormal and inconclusive findings on diagnostic imaging of breast: Secondary | ICD-10-CM | POA: Insufficient documentation

## 2021-02-13 DIAGNOSIS — C50211 Malignant neoplasm of upper-inner quadrant of right female breast: Secondary | ICD-10-CM | POA: Diagnosis not present

## 2021-02-28 ENCOUNTER — Encounter: Payer: Self-pay | Admitting: Nurse Practitioner

## 2021-02-28 ENCOUNTER — Non-Acute Institutional Stay: Payer: Medicare Other | Admitting: Nurse Practitioner

## 2021-02-28 DIAGNOSIS — R5381 Other malaise: Secondary | ICD-10-CM

## 2021-02-28 DIAGNOSIS — Z515 Encounter for palliative care: Secondary | ICD-10-CM

## 2021-02-28 DIAGNOSIS — G4701 Insomnia due to medical condition: Secondary | ICD-10-CM

## 2021-02-28 DIAGNOSIS — M159 Polyosteoarthritis, unspecified: Secondary | ICD-10-CM

## 2021-02-28 NOTE — Progress Notes (Addendum)
[facility] Consult Note Telephone: [phone]  Fax: [phone]    Date of encounter: 02/28/21 9:51 PM PATIENT NAME: Adriana Stevens [id number]   [phone] (home)  DOB: 1926-01-22 MRN: [id number] PRIMARY CARE PROVIDER:    Vidal Schwalbe, MD,  [address] [place] [address] [phone]  RESPONSIBLE PARTY:    Contact Information     Name Relation Home Work Mobile   [facility] Daughter [phone]        I met face to face with patient in facility.  Palliative Care was asked to follow this patient by consultation request of  Vidal Schwalbe, MD to address advance care planning and complex medical decision making. This is a follow up visit.                                  ASSESSMENT AND PLAN / RECOMMENDATIONS:  Symptom Management/Plan: 1. Advance Care Planning; DNR  2. Goals of Care: Goals include to maximize quality of life and symptom management. Our advance care planning conversation included a discussion about:    The value and importance of advance care planning  Exploration of personal, cultural or spiritual beliefs that might influence medical decisions  Exploration of goals of care in the event of a sudden injury or illness  Identification and preparation of a healthcare agent  Review and updating or creation of an advance directive document.  3. Debility secondary to OA; immobility. Pain managed with tylenol, voltaren gel continue to monitor on pain scale. Monitor efficacy vs adverse side effects.   Current weights 12/08/2020 weight 174 lbs at [facility] 01/08/2021 weight 160.7 lbs at [facility] 01/16/2021 weight 159 lbs at [facility]  02/07/2021 weight 161.2 lbs at [facility] BMI 25  01/09/2021 Sodium 128, potassium 3.8, chloride 92, Co2 27, calcium 8.8, bun 1.04 creatinine 1.04, glucose 118, albumin 3.8, total protein 7.3, ast 23, alt 18  4. Insomnia; discussed sleep  patterns, sleep hygiene, continue with current regimen with melatonin, trazodone.  5. Palliative care encounter; Palliative care encounter; Palliative medicine team will continue to support patient, patient's family, and medical team. Visit consisted of counseling and education dealing with the complex and emotionally intense issues of symptom management and palliative care in the setting of serious and potentially life-threatening illness  6. f/u 1 month for ongoing monitoring chronic disease progression, ongoing discussions complex medical decision making  Follow up Palliative Care Visit: Palliative care will continue to follow for complex medical decision making, advance care planning, and clarification of goals. Return 4 weeks or prn.  I spent 62 minutes providing this consultation. More than 50% of the time in this consultation was spent in counseling and care coordination.  PPS: 40%  Chief Complaint: Follow up palliative consult for complex medical decision making  HISTORY OF PRESENT ILLNESS:  Adriana Stevens is a 87 y.o. year old female  with multiple medical problems including Right breast cancer (on anastrozole), ischemic heart disease, h/o TIA, HTN, OA, HLD, blindness right eye, thrombocytopenia, h/o abnormal gait, insomnia.  I visited and observed [name] at [place] in her room, she was sitting in a w/c in her room. [name] and I talked about purpose of pc visit. [name] in agreement. We talked about past medical history as [name] is new to this provider. We talked about breast  cancer with plan, treatment, no noted side effects. We talked about currently [name] is residing at [facility] which is ALF, w/c dependent. [name] transfers self, independently performs her bathing, getting dressed, eating. [name] appetite is good. We talked about nutrition, supplements, importance of nutrition. We talked about symptoms of pain, managed with tylenol and voltaren gel. We  talked about gabapentin. We talked about insomnia including sleep patterns with sleep hygiene. We talked about melatonin, trazodone. [name] endorses she has been sleeping well with current regimen. [name] talked about her right eye which she is blind. We talked about how she functions in her room. We talked about quality of life. We talked about things she likes to do, activities. We talked about residing at facility. We talked about no recent falls, wounds, hospitalizations, infections. We talked about family, support system. We talked about role pc in poc. Therapeutic listening, emotional support provided. Questions answered.   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  Additional suspicious mass with calcifications in the inner right breast measuring up to 2.3 cm. 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 -She does not want to have any surgical intervention. - Anastrozole started on 08/01/2020. Stage I (T2 N0 M0) right breast IDC, ER/PR positive, HER-2 negative: -She reports tolerating anastrozole reasonably well. - Physical examination today revealed 2 cm mass at 12 o'clock position in the right breast.  No palpable adenopathy in the axillary or supraclavicular region. - Last right breast ultrasound on 10/24/2020 showed dominant mass in the right breast at 12 o'clock position measuring 2.6 x 0.7 x 0.9 cm, minimally smaller.  Additional mass in the right breast at 2:30 position is also slightly smaller measuring 0.7 x 0.4 x 0.6 cm. - Recommend follow-up in 2 months with a repeat right breast ultrasound.  History obtained from review of EMR, discussion with facility staff and [name].  I  reviewed available labs, medications, imaging, studies and related documents from the EMR.  Records reviewed and summarized above.   ROS Full 10 system review of systems performed and negative with exception of: as per HPI.   Physical Exam: Constitutional: NAD General: frail appearing, thin pleasant female EYES: lids intact ENMT: oral mucous membranes moist CV: S1S2, RRR Pulmonary: LCTA, no increased work of breathing, no cough, room air Abdomen: normo-active BS + 4 quadrants, soft and non tender MSK: w/c dependent Skin: warm and dry Neuro:  + generalized weakness,  + cognitive impairment Psych: non-anxious affect, A and O x 3  Questions and concerns were addressed. The facility staff was encouraged to call with questions and/or concerns.  Provided general support and encouragement, no other unmet needs identified   Thank you for the opportunity to participate in the care of Ms. Kitts.  The palliative care team will continue to follow. Please call our office at [phone] if we can be of additional assistance.   This chart was dictated using voice recognition software.  Despite best efforts to proofread,  errors can occur which can change the documentation meaning.   [place] Ihor Gully, NP

## 2021-03-01 ENCOUNTER — Other Ambulatory Visit: Payer: Self-pay

## 2021-03-07 ENCOUNTER — Inpatient Hospital Stay (HOSPITAL_COMMUNITY): Payer: Medicare Other

## 2021-03-07 ENCOUNTER — Other Ambulatory Visit: Payer: Self-pay

## 2021-03-07 ENCOUNTER — Inpatient Hospital Stay (HOSPITAL_COMMUNITY): Payer: Medicare Other | Attending: Hematology

## 2021-03-07 DIAGNOSIS — Z803 Family history of malignant neoplasm of breast: Secondary | ICD-10-CM | POA: Insufficient documentation

## 2021-03-07 DIAGNOSIS — Z79811 Long term (current) use of aromatase inhibitors: Secondary | ICD-10-CM | POA: Diagnosis not present

## 2021-03-07 DIAGNOSIS — Z170 Estrogen receptor positive status [ER+]: Secondary | ICD-10-CM | POA: Diagnosis present

## 2021-03-07 DIAGNOSIS — M8580 Other specified disorders of bone density and structure, unspecified site: Secondary | ICD-10-CM | POA: Diagnosis not present

## 2021-03-07 DIAGNOSIS — R1032 Left lower quadrant pain: Secondary | ICD-10-CM | POA: Diagnosis not present

## 2021-03-07 DIAGNOSIS — C50811 Malignant neoplasm of overlapping sites of right female breast: Secondary | ICD-10-CM | POA: Diagnosis not present

## 2021-03-07 DIAGNOSIS — Z79899 Other long term (current) drug therapy: Secondary | ICD-10-CM | POA: Insufficient documentation

## 2021-03-07 DIAGNOSIS — I10 Essential (primary) hypertension: Secondary | ICD-10-CM | POA: Diagnosis not present

## 2021-03-07 DIAGNOSIS — E871 Hypo-osmolality and hyponatremia: Secondary | ICD-10-CM | POA: Insufficient documentation

## 2021-03-07 DIAGNOSIS — C50211 Malignant neoplasm of upper-inner quadrant of right female breast: Secondary | ICD-10-CM

## 2021-03-07 LAB — COMPREHENSIVE METABOLIC PANEL
ALT: 16 U/L (ref 0–44)
AST: 20 U/L (ref 15–41)
Albumin: 3.8 g/dL (ref 3.5–5.0)
Alkaline Phosphatase: 59 U/L (ref 38–126)
Anion gap: 7 (ref 5–15)
BUN: 16 mg/dL (ref 8–23)
CO2: 33 mmol/L — ABNORMAL HIGH (ref 22–32)
Calcium: 9.3 mg/dL (ref 8.9–10.3)
Chloride: 97 mmol/L — ABNORMAL LOW (ref 98–111)
Creatinine, Ser: 1 mg/dL (ref 0.44–1.00)
GFR, Estimated: 53 mL/min — ABNORMAL LOW (ref >60)
Glucose, Bld: 104 mg/dL — ABNORMAL HIGH (ref 70–99)
Potassium: 3.7 mmol/L (ref 3.5–5.1)
Sodium: 137 mmol/L (ref 135–145)
Total Bilirubin: 0.5 mg/dL (ref 0.3–1.2)
Total Protein: 7.3 g/dL (ref 6.5–8.1)

## 2021-03-07 LAB — CBC WITH DIFFERENTIAL/PLATELET
Abs Immature Granulocytes: 0.03 10*3/uL (ref 0.00–0.07)
Basophils Absolute: 0 10*3/uL (ref 0.0–0.1)
Basophils Relative: 0 %
Eosinophils Absolute: 0.1 10*3/uL (ref 0.0–0.5)
Eosinophils Relative: 1 %
HCT: 39.8 % (ref 36.0–46.0)
Hemoglobin: 12.7 g/dL (ref 12.0–15.0)
Immature Granulocytes: 0 %
Lymphocytes Relative: 32 %
Lymphs Abs: 2.4 10*3/uL (ref 0.7–4.0)
MCH: 31.3 pg (ref 26.0–34.0)
MCHC: 31.9 g/dL (ref 30.0–36.0)
MCV: 98 fL (ref 80.0–100.0)
Monocytes Absolute: 0.5 10*3/uL (ref 0.1–1.0)
Monocytes Relative: 7 %
Neutro Abs: 4.3 10*3/uL (ref 1.7–7.7)
Neutrophils Relative %: 60 %
Platelets: 208 10*3/uL (ref 150–400)
RBC: 4.06 MIL/uL (ref 3.87–5.11)
RDW: 12 % (ref 11.5–15.5)
WBC: 7.3 10*3/uL (ref 4.0–10.5)
nRBC: 0 % (ref 0.0–0.2)

## 2021-03-07 LAB — VITAMIN D 25 HYDROXY (VIT D DEFICIENCY, FRACTURES): Vit D, 25-Hydroxy: 28.58 ng/mL — ABNORMAL LOW (ref 30–100)

## 2021-03-14 NOTE — Progress Notes (Signed)
[facility] [address] [place] [address]   Patient Care Team: Vidal Schwalbe, MD as PCP - General (Family Medicine) Brien Mates, RN as Oncology Nurse Navigator (Oncology) Jason Coop, NP as Nurse Practitioner (Hospice and Palliative Medicine) Harrie Foreman, NP as Referring Physician (Adult Health Nurse Practitioner)  SUMMARY OF ONCOLOGIC HISTORY: Oncology History   No history exists.    CHIEF COMPLIANT: Follow-up for right breast cancer   INTERVAL HISTORY: Ms. Adriana Stevens is a 87 y.o. female here today for follow up of her right breast cancer. Her last visit was on 01/16/2021.   Today she reports feeling well. She reports pain in her left side. She denies dysuria. She is currently living at [facility] assisted living facility. She reports reduced vision in her right eye.   REVIEW OF SYSTEMS:   Review of Systems  Constitutional:  Positive for fatigue. Negative for appetite change.  Eyes:  Positive for eye problems (R eye).  Respiratory:  Positive for cough.   Genitourinary:  Positive for dysuria.   Musculoskeletal:  Positive for flank pain (5/10 L side).  Neurological:  Positive for numbness.  Psychiatric/Behavioral:  Positive for sleep disturbance.   All other systems reviewed and are negative.  I have reviewed the past medical history, past surgical history, social history and family history with the patient and they are unchanged from previous note.   ALLERGIES:   has No Known Allergies.   MEDICATIONS:  Current Outpatient Medications  Medication Sig Dispense Refill   acetaminophen (TYLENOL) 500 MG tablet Take 1,000 mg by mouth 3 (three) times daily.     alum & mag hydroxide-simeth (MAALOX/MYLANTA) 200-200-20 MG/5ML suspension Take 30 mLs by mouth every 6 (six) hours as needed for indigestion or heartburn. (Patient not taking: Reported on 01/16/2021)     Amino Acids-Protein Hydrolys (FEEDING SUPPLEMENT, PRO-STAT  64,) LIQD Take 30 mLs by mouth daily.     amLODipine (NORVASC) 10 MG tablet Take 10 mg by mouth daily.     anastrozole (ARIMIDEX) 1 MG tablet Take 1 tablet (1 mg total) by mouth daily. 30 tablet 6   Ascorbic Acid 500 MG/15ML LIQD 1 tablet     diclofenac Sodium (VOLTAREN) 1 % GEL See admin instructions.     docusate sodium (COLACE) 100 MG capsule Take 100 mg by mouth daily.     famotidine (PEPCID) 20 MG tablet Take 20 mg by mouth 2 (two) times daily.     gabapentin (NEURONTIN) 100 MG capsule Take 100 mg by mouth at bedtime.     hydroxypropyl methylcellulose / hypromellose (ISOPTO TEARS / GONIOVISC) 2.5 % ophthalmic solution 1 drop 4 (four) times daily.     latanoprost (XALATAN) 0.005 % ophthalmic solution SMARTSIG:In Eye(s)     loteprednol (LOTEMAX) 0.5 % ophthalmic suspension SMARTSIG:In Eye(s)     Magnesium 400 MG TABS Take 1 tablet by mouth at bedtime.     magnesium hydroxide (MILK OF MAGNESIA) 400 MG/5ML suspension Take 30 mLs by mouth daily as needed for mild constipation. (Patient not taking: Reported on 01/16/2021)     melatonin 3 MG TABS tablet Take 3 mg by mouth at bedtime.     Multiple Vitamin (TAB-A-VITE) TABS Take 1 tablet by mouth daily.     nitroGLYCERIN (NITROSTAT) 0.4 MG SL tablet Place 0.4 mg under the tongue every 5 (five) minutes as needed for chest pain. (Patient not taking: Reported on 01/16/2021)     olmesartan-hydrochlorothiazide (BENICAR HCT) 40-12.5  MG tablet Take 1 tablet by mouth daily.     potassium chloride SA (KLOR-CON) 20 MEQ tablet Take 20 mEq by mouth daily.     senna (SENOKOT) 8.6 MG TABS tablet Take 2 tablets by mouth at bedtime.     traZODone (DESYREL) 50 MG tablet Take 50 mg by mouth at bedtime as needed for sleep. (Patient not taking: Reported on 01/16/2021)     No current facility-administered medications for this visit.     PHYSICAL EXAMINATION: Performance status (ECOG): 3 - Symptomatic, >50% confined to bed  Vitals:   03/15/21 1458  BP: (!) 158/95   Pulse: 76  Resp: 19  Temp: 97.7 F (36.5 C)  SpO2: 98%   Wt Readings from Last 3 Encounters:  01/16/21 159 lb 14.4 oz (72.5 kg)  11/02/20 160 lb 11.2 oz (72.9 kg)  08/31/20 165 lb 12.8 oz (75.2 kg)   Physical Exam Vitals reviewed.  Constitutional:      Appearance: Normal appearance.     Comments: In wheelchair  Cardiovascular:     Rate and Rhythm: Normal rate and regular rhythm.     Pulses: Normal pulses.     Heart sounds: Normal heart sounds.  Pulmonary:     Effort: Pulmonary effort is normal.     Breath sounds: Normal breath sounds.  Neurological:     General: No focal deficit present.     Mental Status: She is alert and oriented to person, place, and time.  Psychiatric:        Mood and Affect: Mood normal.        Behavior: Behavior normal.    Breast Exam Chaperone: Thana Ates     LABORATORY DATA:  I have reviewed the data as listed CMP Latest Ref Rng & Units 03/07/2021 01/09/2021 11/02/2020  Glucose 70 - 99 mg/dL 104(H) 118(H) 108(H)  BUN 8 - 23 mg/dL 16 19 20   Creatinine 0.44 - 1.00 mg/dL 1.00 1.04(H) 1.08(H)  Sodium 135 - 145 mmol/L 137 128(L) 136  Potassium 3.5 - 5.1 mmol/L 3.7 3.8 3.5  Chloride 98 - 111 mmol/L 97(L) 92(L) 99  CO2 22 - 32 mmol/L 33(H) 27 28  Calcium 8.9 - 10.3 mg/dL 9.3 8.8(L) 9.4  Total Protein 6.5 - 8.1 g/dL 7.3 7.3 7.4  Total Bilirubin 0.3 - 1.2 mg/dL 0.5 0.5 0.5  Alkaline Phos 38 - 126 U/L 59 67 64  AST 15 - 41 U/L 20 23 22   ALT 0 - 44 U/L 16 18 17    No results found for: INO676 Lab Results  Component Value Date   WBC 7.3 03/07/2021   HGB 12.7 03/07/2021   HCT 39.8 03/07/2021   MCV 98.0 03/07/2021   PLT 208 03/07/2021   NEUTROABS 4.3 03/07/2021    ASSESSMENT:  1.  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  Additional suspicious mass with calcifications in the inner right breast  measuring up to 2.3 cm. 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 -She does not want to have any surgical intervention. - Anastrozole started on 08/01/2020.   2.  Social/family history: -She lives [facility]  She uses wheelchair for ambulation and is able to dress herself and feed herself. 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  Never smoker. -Twin sister had cancer.  Brother had cancer.  Another sister had breast cancer.   3.  Bone health: - DEXA scan on 08/31/2020 T score -1.8, osteopenia.   PLAN:  1.  Stage I (T2 N0 M0) right breast IDC, ER/PR positive, HER-2 negative: -She is tolerating anastrozole very well. - No new pains reported.  Reviewed labs from 03/07/2021 which showed normal LFTs and CBC. - Reviewed right breast ultrasound from 02/13/2021 which showed dominant mass in the right breast at 12 o'clock position is minimally smaller at 2.2 x 0.8 x 2 cm, previously is 2.6 x 0.8 x 1.4 cm.  Interval decrease in size of the original mass in the right breast at 2:30 position now measuring 0.8 x 0.4 x 0.6 cm previously 1.2 x 0.4 x 0.8 cm. - Continue anastrozole at this time.  RTC 4 months with repeat ultrasound of the right breast.   2.  Bone health: -Continue calcium and vitamin D supplements.  Vitamin D level today is 28.  3.  Hyponatremia: -Sodium is normal on today's labs at 137.  Breast Cancer therapy associated bone loss: I have recommended calcium, Vitamin D and weight bearing exercises.  Orders placed this encounter:  No orders of the defined types were placed in this encounter.   The patient has a good understanding of the overall plan. She agrees with it. She will call with any problems that may develop before the next visit here.  Derek Jack, MD [place] [phone]   I, Thana Ates, am acting as a scribe for Dr. Derek Jack.  I, Derek Jack MD, have reviewed the above documentation for accuracy and completeness, and I agree with the above.

## 2021-03-15 ENCOUNTER — Inpatient Hospital Stay (HOSPITAL_BASED_OUTPATIENT_CLINIC_OR_DEPARTMENT_OTHER): Payer: Medicare Other | Admitting: Hematology

## 2021-03-15 ENCOUNTER — Other Ambulatory Visit: Payer: Self-pay

## 2021-03-15 VITALS — BP 158/95 | HR 76 | Temp 97.7°F | Resp 19

## 2021-03-15 DIAGNOSIS — C50211 Malignant neoplasm of upper-inner quadrant of right female breast: Secondary | ICD-10-CM | POA: Diagnosis not present

## 2021-03-15 DIAGNOSIS — Z170 Estrogen receptor positive status [ER+]: Secondary | ICD-10-CM

## 2021-03-15 DIAGNOSIS — C50811 Malignant neoplasm of overlapping sites of right female breast: Secondary | ICD-10-CM | POA: Diagnosis not present

## 2021-03-15 NOTE — Progress Notes (Signed)
[place] of right breast and labs ordered per Dr. Tomie China order.

## 2021-03-15 NOTE — Patient Instructions (Addendum)
[place] at [facility] Discharge Instructions  You were seen and examined today by Dr. Delton Coombes. He reviewed your labs and everything looks good. Continue taking the anastrozole as prescribed. Please keep follow up as scheduled.   Thank you for choosing [place] at [facility] to provide your oncology and hematology care.  To afford each patient quality time with our provider, please arrive at least 15 minutes before your scheduled appointment time.   If you have a lab appointment with the [facility] please come in thru the Main Entrance and check in at the main information desk.  You need to re-schedule your appointment should you arrive 10 or more minutes late.  We strive to give you quality time with our providers, and arriving late affects you and other patients whose appointments are after yours.  Also, if you no show three or more times for appointments you may be dismissed from the clinic at the providers discretion.     Again, thank you for choosing [facility].  Our hope is that these requests will decrease the amount of time that you wait before being seen by our physicians.       _____________________________________________________________  Should you have questions after your visit to [facility], please contact our office at [phone] and follow the prompts.  Our office hours are 8:00 a.m. and 4:30 p.m. Monday - Friday.  Please note that voicemails left after 4:00 p.m. may not be returned until the following business day.  We are closed weekends and major holidays.  You do have access to a nurse 24-7, just call the main number to the clinic [phone] and do not press any options, hold on the line and a nurse will answer the phone.    For prescription refill requests, have your pharmacy contact our office and allow 72 hours.    Due to Covid, you will need to wear a mask upon entering the hospital.  If you do not have a mask, a mask will be given to you at the Main Entrance upon arrival. For doctor visits, patients may have 1 support person age 62 or older with them. For treatment visits, patients can not have anyone with them due to social distancing guidelines and our immunocompromised population.

## 2021-05-14 ENCOUNTER — Other Ambulatory Visit: Payer: Self-pay

## 2021-05-14 ENCOUNTER — Encounter: Payer: Self-pay | Admitting: Nurse Practitioner

## 2021-05-14 ENCOUNTER — Non-Acute Institutional Stay: Payer: Medicare Other | Admitting: Nurse Practitioner

## 2021-05-14 VITALS — Resp 20 | Wt 159.5 lb

## 2021-05-14 DIAGNOSIS — Z515 Encounter for palliative care: Secondary | ICD-10-CM

## 2021-05-14 DIAGNOSIS — F5101 Primary insomnia: Secondary | ICD-10-CM

## 2021-05-14 DIAGNOSIS — R634 Abnormal weight loss: Secondary | ICD-10-CM

## 2021-05-14 NOTE — Progress Notes (Signed)
[place] Consult Note Telephone: [phone]  Fax: [phone]    Date of encounter: 05/14/21 7:50 PM PATIENT NAME: Adriana Stevens [id number]   [phone] (home)  DOB: 08-May-1925 MRN: [id number] PRIMARY CARE PROVIDER:    Nathalie  RESPONSIBLE PARTY:    Contact Information     Name Relation Home Work Mobile   [facility] Daughter [phone]        I met face to face with patient in facility. Palliative Care was asked to follow this patient by consultation request of  [place] to address advance care planning and complex medical decision making. This is a follow up visit.                                  ASSESSMENT AND PLAN / RECOMMENDATIONS:  Symptom Management/Plan: 1. Advance Care Planning; DNR; Ms. Jordahl continues on Anastrozole for breast cancer, aggressive intervention at this time would not be eligible for Hospice services under Medicare benefit.   2. Goals of Care: Goals include to maximize quality of life and symptom management. Our advance care planning conversation included a discussion about:    The value and importance of advance care planning  Exploration of personal, cultural or spiritual beliefs that might influence medical decisions  Exploration of goals of care in the event of a sudden injury or illness  Identification and preparation of a healthcare agent  Review and updating or creation of an advance directive document.   3. Weight loss; reviewed weights; will continue to monitor, discussed nutrition, foods Ms. Hannula likes.    Current weights 12/08/2020 weight 174 lbs at [facility] 01/08/2021 weight 160.7 lbs at [facility] 01/16/2021 weight 159 lbs at [place]  02/07/2021 weight 161.2 lbs at [facility] 03/10/2021 weight 162.3 lbs 04/09/2021 weight 162.3 lbs 05/10/2021 weight 159.5 lbs 14.5 lbs loss/5 months with 8.33%   4. Insomnia; discussed sleep  patterns, sleep hygiene, continue with current regimen with melatonin, trazodone. Discussed with staff option of changing out to another mattress.    5. Palliative care encounter; Palliative care encounter; Palliative medicine team will continue to support patient, patient's family, and medical team. Visit consisted of counseling and education dealing with the complex and emotionally intense issues of symptom management and palliative care in the setting of serious and potentially life-threatening illness   6. f/u 1 month for ongoing monitoring chronic disease progression, ongoing discussions complex medical decision making  Follow up Palliative Care Visit: Palliative care will continue to follow for complex medical decision making, advance care planning, and clarification of goals. Return 4 weeks or prn.  I spent 63 minutes providing this consultation. More than 50% of the time in this consultation was spent in counseling and care coordination. PPS: 40%  Chief Complaint: Follow up palliative consult for complex medical decision making  HISTORY OF PRESENT ILLNESS:  Sanjana B Pasch is a 88 y.o. year old female  with multiple medical problems including Right breast cancer continues on Anastrozole, ischemic heart disease, h/o TIA, HTN, OA, HLD, blindness right eye, thrombocytopenia, h/o abnormal gait, insomnia.  I visited and observed Ms. Otero at [place] in her room, she was sitting in a w/c in her room. Ms. Dalia and I talked about purpose of pc visit. Ms. Notarianni in agreement. We talked symptoms, ros, appetite, weight loss. We talked about nutrition,  foods she likes. Ms. Trigo transfers self, independently performs her bathing, getting dressed, eating. We talked about symptoms of pain, managed with tylenol and voltaren gel. We talked about gabapentin. We talked about insomnia including sleep patterns with sleep hygiene. We talked about melatonin, trazodone. Ms. Charter endorses she has been  sleeping well with current regimen. Ms. Frieling talked about her right eye which she is blind. We talked about how she functions in her room. We talked about quality of life. We talked about things she likes to do, activities. We talked about residing at facility. We talked about no recent falls, wounds, hospitalizations, infections. We talked about family, support system. We talked about role pc in poc. Therapeutic listening, emotional support provided. Questions answered.   Oncology plan:  Stage I (T2 N0 M0) right breast IDC, ER/PR positive, HER-2 negative: -She is tolerating anastrozole very well. - No new pains reported.  Reviewed labs from 03/07/2021 which showed normal LFTs and CBC. - Reviewed right breast ultrasound from 02/13/2021 which showed dominant mass in the right breast at 12 o'clock position is minimally smaller at 2.2 x 0.8 x 2 cm, previously is 2.6 x 0.8 x 1.4 cm.  Interval decrease in size of the original mass in the right breast at 2:30 position now measuring 0.8 x 0.4 x 0.6 cm previously 1.2 x 0.4 x 0.8 cm. - Continue anastrozole at this time.  RTC 4 months with repeat ultrasound of the right breast.  History obtained from review of EMR, discussion with facility staff and Ms. Terriquez.  I reviewed available labs, medications, imaging, studies and related documents from the EMR.  Records reviewed and summarized above.   ROS 10 point system reviewed with staff and Ms. Salak, negative except HPI  Physical Exam: Constitutional: NAD General: frail appearing, thin, pleasant female EYES: lids intact ENMT: oral mucous membranes moist CV: S1S2, RRR, Pulmonary: LCTA, no increased work of breathing, no cough, room air Abdomen:  normo-active BS + 4 quadrants, soft and non tender MSK: w/c dependent Skin: warm and dry Neuro:  + generalized weakness,  no cognitive impairment Psych: non-anxious affect, A and O x 3 Thank you for the opportunity to participate in the care of Ms. Robart.  The  palliative care team will continue to follow. Please call our office at [phone] if we can be of additional assistance.   This chart was dictated using voice recognition software.  Despite best efforts to proofread,  errors can occur which can change the documentation meaning.   Questions and concerns were addressed. Provided general support and encouragement, no other unmet needs identified   Marta Bou[place] Ihor Gully, NP

## 2021-06-22 ENCOUNTER — Non-Acute Institutional Stay: Payer: Medicare Other | Admitting: Nurse Practitioner

## 2021-06-22 ENCOUNTER — Other Ambulatory Visit: Payer: Self-pay

## 2021-06-22 VITALS — HR 82 | Resp 18 | Wt 159.0 lb

## 2021-06-22 DIAGNOSIS — F5101 Primary insomnia: Secondary | ICD-10-CM

## 2021-06-22 DIAGNOSIS — R634 Abnormal weight loss: Secondary | ICD-10-CM

## 2021-06-22 DIAGNOSIS — R5381 Other malaise: Secondary | ICD-10-CM

## 2021-06-22 DIAGNOSIS — Z515 Encounter for palliative care: Secondary | ICD-10-CM

## 2021-06-22 NOTE — Progress Notes (Signed)
[facility] Palliative Care Consult Note Telephone: [phone]  Fax: [phone]    Date of encounter: 06/22/21 10:10 AM PATIENT NAME: Adriana Stevens Adriana Stevens [id number]   [phone] (home)  DOB: 06-29-1925 MRN: [id number] PRIMARY CARE PROVIDER:    Kino Stevens  RESPONSIBLE PARTY:    Contact Information     Name Relation Home Work Mobile   [facility] Daughter [phone]        I met face to face with patient in facility. Palliative Care was asked to follow this patient by consultation request of Adriana Stevens to address advance care planning and complex medical decision making. This is a follow up visit.                                 \ ASSESSMENT AND PLAN / RECOMMENDATIONS:  Symptom Management/Plan: 1. Advance Care Planning; DNR; Adriana Stevens continues on Anastrozole for breast cancer, aggressive intervention at this time would not be eligible for Hospice services under Medicare benefit.   2. Weight loss; reviewed weights; discussed nutrition, foods Adriana Stevens likes. .5lb weight loss last 4 weeks; will continue to monitor,    Current weights 12/08/2020 weight 174 lbs at [facility] 01/08/2021 weight 160.7 lbs at [facility] 01/16/2021 weight 159 lbs at [facility]  02/07/2021 weight 161.2 lbs at [facility] 03/10/2021 weight 162.3 lbs 04/09/2021 weight 162.3 lbs 05/10/2021 weight 159.5 lbs 06/10/2021 weight 159 lbs   3. Insomnia; improving; discussed sleep patterns, sleep hygiene, continue with current regimen with melatonin, trazodone.    4. Palliative care encounter; Palliative care encounter; Palliative medicine team will continue to support patient, patient's family, and medical team. Visit consisted of counseling and education dealing with the complex and emotionally intense issues of symptom management and palliative care in the setting of serious and potentially life-threatening illness   5. f/u 8 month for ongoing  monitoring chronic disease progression, ongoing discussions complex medical decision making Follow up Palliative Care Visit: Palliative care will continue to follow for complex medical decision making, advance care planning, and clarification of goals. Return 8 weeks or prn.  I spent 47 minutes providing this consultation starting at 9:15am. More than 50% of the time in this consultation was spent in counseling and care coordination. PPS: 40%  Chief Complaint: Follow up palliative consult for complex medical decision making  HISTORY OF PRESENT ILLNESS:  Adriana Stevens is a 88 y.o. year old female  with multiple medical problems including Right breast cancer continues on Anastrozole, ischemic heart disease, h/o TIA, HTN, OA, HLD, blindness right eye, thrombocytopenia, h/o abnormal gait, insomnia.  I visited and observed Adriana Stevens at [facility] in her room, she was sitting in a w/c in her room. Adriana Stevens and I talked about purpose of pc visit. Adriana Stevens in agreement. We talked symptoms, ros, appetite, weight loss. We talked about nutrition, foods she likes. Adriana Stevens transfers self, independently performs her bathing, getting dressed, eating. We talked about insomnia including sleep patterns with sleep hygiene. We talked about melatonin, trazodone. Adriana Stevens endorses she has been sleeping well with current regimen. Adriana Stevens complains about her right eye which she is blind. We talked about how she functions in her room. We talked about quality of life. Adriana Stevens endorses it has becoming more difficult for her to move around in her room, bath, dress, eat with her  right eye causing her more discomfort. We talked about coping strategies. We talked about things she likes to do, activities. We talked about residing at facility. We talked about no recent falls, wounds, hospitalizations, infections. We talked about family, support system. We talked about role pc in poc. Therapeutic listening, emotional  support provided. Questions answered.  History obtained from review of EMR, discussion with facility staff and  Adriana Stevens.  I reviewed available labs, medications, imaging, studies and related documents from the EMR.  Records reviewed and summarized above.   ROS 10 point system reviewed with facility staff and Adriana Stevens all negative except HPI  Physical Exam: Constitutional: NAD General: frail appearing, pleasant female EYES: lids intact ENMT: oral mucous membranes moist CV: S1S2, RRR Pulmonary: LCTA, no increased work of breathing, no cough, room air Abdomen:  normo-active BS + 4 quadrants, soft and non tender MSK: w/c dependent Skin: warm and dry Neuro:  + generalized weakness,  no cognitive impairment Psych: non-anxious affect, A and O x 3 Thank you for the opportunity to participate in the care of Adriana Stevens.  The palliative care team will continue to follow. Please call our office at [phone] if we can be of additional assistance.   Lilli Dewald Ihor Gully, NP

## 2021-07-03 ENCOUNTER — Other Ambulatory Visit (HOSPITAL_COMMUNITY): Payer: Medicare Other

## 2021-07-03 ENCOUNTER — Ambulatory Visit (HOSPITAL_COMMUNITY): Admission: RE | Admit: 2021-07-03 | Payer: Medicare Other | Source: Ambulatory Visit

## 2021-07-11 ENCOUNTER — Inpatient Hospital Stay (HOSPITAL_COMMUNITY): Payer: Medicare Other

## 2021-07-17 ENCOUNTER — Ambulatory Visit (HOSPITAL_COMMUNITY): Payer: Medicare Other | Admitting: Hematology

## 2021-07-18 ENCOUNTER — Ambulatory Visit (HOSPITAL_COMMUNITY): Payer: Medicare Other | Admitting: Hematology

## 2021-07-24 ENCOUNTER — Ambulatory Visit (HOSPITAL_COMMUNITY)
Admission: RE | Admit: 2021-07-24 | Discharge: 2021-07-24 | Disposition: A | Discharge status: Home / Self Care | Payer: Medicare Other | Source: Ambulatory Visit | Attending: Hematology | Admitting: Hematology

## 2021-07-24 ENCOUNTER — Other Ambulatory Visit: Payer: Self-pay

## 2021-07-24 ENCOUNTER — Inpatient Hospital Stay (HOSPITAL_COMMUNITY): Payer: Medicare Other | Attending: Hematology

## 2021-07-24 DIAGNOSIS — Z803 Family history of malignant neoplasm of breast: Secondary | ICD-10-CM | POA: Diagnosis not present

## 2021-07-24 DIAGNOSIS — C50211 Malignant neoplasm of upper-inner quadrant of right female breast: Secondary | ICD-10-CM | POA: Insufficient documentation

## 2021-07-24 DIAGNOSIS — I10 Essential (primary) hypertension: Secondary | ICD-10-CM | POA: Insufficient documentation

## 2021-07-24 DIAGNOSIS — Z170 Estrogen receptor positive status [ER+]: Secondary | ICD-10-CM | POA: Insufficient documentation

## 2021-07-24 DIAGNOSIS — Z79899 Other long term (current) drug therapy: Secondary | ICD-10-CM | POA: Diagnosis not present

## 2021-07-24 DIAGNOSIS — M8580 Other specified disorders of bone density and structure, unspecified site: Secondary | ICD-10-CM | POA: Insufficient documentation

## 2021-07-24 DIAGNOSIS — Z79811 Long term (current) use of aromatase inhibitors: Secondary | ICD-10-CM | POA: Diagnosis not present

## 2021-07-24 DIAGNOSIS — C50811 Malignant neoplasm of overlapping sites of right female breast: Secondary | ICD-10-CM | POA: Insufficient documentation

## 2021-07-24 LAB — COMPREHENSIVE METABOLIC PANEL
ALT: 11 U/L (ref 0–44)
AST: 21 U/L (ref 15–41)
Albumin: 4 g/dL (ref 3.5–5.0)
Alkaline Phosphatase: 62 U/L (ref 38–126)
Anion gap: 13 (ref 5–15)
BUN: 18 mg/dL (ref 8–23)
CO2: 26 mmol/L (ref 22–32)
Calcium: 9.1 mg/dL (ref 8.9–10.3)
Chloride: 99 mmol/L (ref 98–111)
Creatinine, Ser: 1.15 mg/dL — ABNORMAL HIGH (ref 0.44–1.00)
GFR, Estimated: 44 mL/min — ABNORMAL LOW (ref >60)
Glucose, Bld: 113 mg/dL — ABNORMAL HIGH (ref 70–99)
Potassium: 3.8 mmol/L (ref 3.5–5.1)
Sodium: 138 mmol/L (ref 135–145)
Total Bilirubin: 0.5 mg/dL (ref 0.3–1.2)
Total Protein: 7.6 g/dL (ref 6.5–8.1)

## 2021-07-24 LAB — CBC WITH DIFFERENTIAL/PLATELET
Abs Immature Granulocytes: 0.04 10*3/uL (ref 0.00–0.07)
Basophils Absolute: 0 10*3/uL (ref 0.0–0.1)
Basophils Relative: 1 %
Eosinophils Absolute: 0.1 10*3/uL (ref 0.0–0.5)
Eosinophils Relative: 1 %
HCT: 42.8 % (ref 36.0–46.0)
Hemoglobin: 13.4 g/dL (ref 12.0–15.0)
Immature Granulocytes: 1 %
Lymphocytes Relative: 26 %
Lymphs Abs: 2.1 10*3/uL (ref 0.7–4.0)
MCH: 29.9 pg (ref 26.0–34.0)
MCHC: 31.3 g/dL (ref 30.0–36.0)
MCV: 95.5 fL (ref 80.0–100.0)
Monocytes Absolute: 0.5 10*3/uL (ref 0.1–1.0)
Monocytes Relative: 6 %
Neutro Abs: 5.3 10*3/uL (ref 1.7–7.7)
Neutrophils Relative %: 65 %
Platelets: 145 10*3/uL — ABNORMAL LOW (ref 150–400)
RBC: 4.48 MIL/uL (ref 3.87–5.11)
RDW: 12.6 % (ref 11.5–15.5)
WBC: 8.1 10*3/uL (ref 4.0–10.5)
nRBC: 0 % (ref 0.0–0.2)

## 2021-07-24 LAB — VITAMIN D 25 HYDROXY (VIT D DEFICIENCY, FRACTURES): Vit D, 25-Hydroxy: 33.19 ng/mL (ref 30–100)

## 2021-07-27 ENCOUNTER — Other Ambulatory Visit: Payer: Self-pay

## 2021-07-27 ENCOUNTER — Other Ambulatory Visit (HOSPITAL_COMMUNITY): Payer: Self-pay | Admitting: *Deleted

## 2021-07-27 ENCOUNTER — Inpatient Hospital Stay (HOSPITAL_BASED_OUTPATIENT_CLINIC_OR_DEPARTMENT_OTHER): Payer: Medicare Other | Admitting: Hematology

## 2021-07-27 VITALS — BP 157/96 | HR 74 | Temp 97.7°F | Resp 16 | Wt 170.4 lb

## 2021-07-27 DIAGNOSIS — Z170 Estrogen receptor positive status [ER+]: Secondary | ICD-10-CM

## 2021-07-27 DIAGNOSIS — C50811 Malignant neoplasm of overlapping sites of right female breast: Secondary | ICD-10-CM | POA: Diagnosis not present

## 2021-07-27 DIAGNOSIS — C50211 Malignant neoplasm of upper-inner quadrant of right female breast: Secondary | ICD-10-CM

## 2021-07-27 NOTE — Patient Instructions (Addendum)
[place] at [facility] ?Discharge Instructions ? ? ?You were seen and examined today by Dr. Delton Coombes. ? ?He discussed the results of your ultrasound. Your breast cancer is stable. Your lab work is also stable.  ? ?Return as scheduled in 6 months.  ? ? ?Thank you for choosing [place] at [facility] to provide your oncology and hematology care.  To afford each patient quality time with our provider, please arrive at least 15 minutes before your scheduled appointment time.  ? ?If you have a lab appointment with the [facility] please come in thru the Main Entrance and check in at the main information desk. ? ?You need to re-schedule your appointment should you arrive 10 or more minutes late.  We strive to give you quality time with our providers, and arriving late affects you and other patients whose appointments are after yours.  Also, if you no show three or more times for appointments you may be dismissed from the clinic at the providers discretion.     ?Again, thank you for choosing [facility].  Our hope is that these requests will decrease the amount of time that you wait before being seen by our physicians.       ?_____________________________________________________________ ? ?Should you have questions after your visit to [facility], please contact our office at [phone] and follow the prompts.  Our office hours are 8:00 a.m. and 4:30 p.m. Monday - Friday.  Please note that voicemails left after 4:00 p.m. may not be returned until the following business day.  We are closed weekends and major holidays.  You do have access to a nurse 24-7, just call the main number to the clinic [phone] and do not press any options, hold on the line and a nurse will answer the phone.   ? ?For prescription refill requests, have your pharmacy contact our office and allow 72 hours.   ? ?Due to Covid, you will need to wear a mask upon  entering the hospital. If you do not have a mask, a mask will be given to you at the Main Entrance upon arrival. For doctor visits, patients may have 1 support person age 35 or older with them. For treatment visits, patients can not have anyone with them due to social distancing guidelines and our immunocompromised population.  ? ?   ?

## 2021-07-27 NOTE — Progress Notes (Signed)
? ?[facility] ?[address] ?[place], [place] [address] ? ? ?Patient Care Team: ?Vidal Schwalbe, MD as PCP - General (Family Medicine) ?Brien Mates, RN as Oncology Nurse Navigator (Oncology) ?Jason Coop, NP as Nurse Practitioner [facility] and Palliative Medicine) ?Harrie Foreman, NP as Referring Physician (Adult Health Nurse Practitioner) ? ?SUMMARY OF ONCOLOGIC HISTORY: ?Oncology History  ? No history exists.  ? ? ?CHIEF COMPLIANT: Follow-up for right breast cancer ? ? ?INTERVAL HISTORY: Ms. Adriana Stevens is a 88 y.o. female here today for follow up of her right breast cancer. Her last visit was on 03/15/2021.  ? ?Today she reports feeling good. She denies hot flashes and new pains. She is not currently taking vitamin D.  ? ?REVIEW OF SYSTEMS:   ?Review of Systems  ?Constitutional:  Negative for appetite change and fatigue.  ?Respiratory:  Positive for cough.   ?Cardiovascular:  Positive for chest pain.  ?Endocrine: Negative for hot flashes.  ?Musculoskeletal:  Negative for arthralgias.  ?Neurological:  Positive for numbness.  ?All other systems reviewed and are negative. ? ?I have reviewed the past medical history, past surgical history, social history and family history with the patient and they are unchanged from previous note. ? ? ?ALLERGIES:   ?has No Known Allergies. ? ? ?MEDICATIONS:  ?Current Outpatient Medications  ?Medication Sig Dispense Refill  ? acetaminophen (TYLENOL) 500 MG tablet Take 1,000 mg by mouth 3 (three) times daily.    ? alum & mag hydroxide-simeth (MAALOX/MYLANTA) 200-200-20 MG/5ML suspension Take 30 mLs by mouth every 6 (six) hours as needed for indigestion or heartburn.    ? Amino Acids-Protein Hydrolys (FEEDING SUPPLEMENT, PRO-STAT 64,) LIQD Take 30 mLs by mouth daily.    ? amLODipine (NORVASC) 10 MG tablet Take 10 mg by mouth daily.    ? anastrozole (ARIMIDEX) 1 MG tablet Take 1 tablet (1 mg total) by mouth daily. 30 tablet 6  ? Ascorbic Acid  500 MG/15ML LIQD 1 tablet    ? ASPERCREME LIDOCAINE 4 % PTCH Apply 1 patch topically daily.    ? diclofenac Sodium (VOLTAREN) 1 % GEL See admin instructions.    ? docusate sodium (COLACE) 100 MG capsule Take 100 mg by mouth daily.    ? famotidine (PEPCID) 20 MG tablet Take 20 mg by mouth 2 (two) times daily.    ? gabapentin (NEURONTIN) 100 MG capsule Take 100 mg by mouth at bedtime.    ? hydroxypropyl methylcellulose / hypromellose (ISOPTO TEARS / GONIOVISC) 2.5 % ophthalmic solution 1 drop 4 (four) times daily.    ? latanoprost (XALATAN) 0.005 % ophthalmic solution SMARTSIG:In Eye(s)    ? loteprednol (LOTEMAX) 0.5 % ophthalmic suspension SMARTSIG:In Eye(s)    ? Magnesium 400 MG TABS Take 1 tablet by mouth at bedtime.    ? magnesium hydroxide (MILK OF MAGNESIA) 400 MG/5ML suspension Take 30 mLs by mouth daily as needed for mild constipation.    ? melatonin 3 MG TABS tablet Take 3 mg by mouth at bedtime.    ? Multiple Vitamin (TAB-A-VITE) TABS Take 1 tablet by mouth daily.    ? nitroGLYCERIN (NITROSTAT) 0.4 MG SL tablet Place 0.4 mg under the tongue every 5 (five) minutes as needed for chest pain.    ? olmesartan-hydrochlorothiazide (BENICAR HCT) 40-12.5 MG tablet Take 1 tablet by mouth daily.    ? potassium chloride SA (KLOR-CON) 20 MEQ tablet Take 20 mEq by mouth daily.    ? senna (SENOKOT) 8.6 MG TABS  tablet Take 2 tablets by mouth at bedtime.    ? sodium chloride (MURO 128) 5 % ophthalmic solution SMARTSIG:In Eye(s)    ? SYSTANE 0.4-0.3 % SOLN Apply to eye.    ? traZODone (DESYREL) 50 MG tablet Take 50 mg by mouth at bedtime as needed for sleep.    ? ?No current facility-administered medications for this visit.  ? ? ? ?PHYSICAL EXAMINATION: ?Performance status (ECOG): 3 - Symptomatic, >50% confined to bed ? ?Vitals:  ? 07/27/21 1031  ?BP: (!) 157/96  ?Pulse: 74  ?Resp: 16  ?Temp: 97.7 ?F (36.5 ?C)  ?SpO2: 94%  ? ?Wt Readings from Last 3 Encounters:  ?07/27/21 170 lb 6.4 oz (77.3 kg)  ?06/22/21 159 lb (72.1 kg)   ?05/14/21 159 lb 8 oz (72.3 kg)  ? ?Physical Exam ?Vitals reviewed.  ?Constitutional:   ?   Appearance: Normal appearance.  ?   Comments: In wheelchair  ?Cardiovascular:  ?   Rate and Rhythm: Normal rate and regular rhythm.  ?   Pulses: Normal pulses.  ?   Heart sounds: Normal heart sounds.  ?Pulmonary:  ?   Effort: Pulmonary effort is normal.  ?   Breath sounds: Normal breath sounds.  ?Chest:  ?Breasts: ?   Right: Mass (2 cm nodule @ 12:00) present. No swelling, bleeding, inverted nipple, nipple discharge, skin change or tenderness.  ?Lymphadenopathy:  ?   Upper Body:  ?   Right upper body: No supraclavicular or axillary adenopathy.  ?Neurological:  ?   General: No focal deficit present.  ?   Mental Status: She is alert and oriented to person, place, and time.  ?Psychiatric:     ?   Mood and Affect: Mood normal.     ?   Behavior: Behavior normal.  ? ? ?Breast Exam Chaperone: Thana Ates   ? ? ?LABORATORY DATA:  ?I have reviewed the data as listed ? ?  Latest Ref Rng & Units 07/24/2021  ?  3:11 PM 03/07/2021  ? 11:08 AM 01/09/2021  ?  2:10 PM  ?CMP  ?Glucose 70 - 99 mg/dL 113   104   118    ?BUN 8 - 23 mg/dL _0 ?Creatinine 0.44 - 1.00 mg/dL 1.15   1.00   1.04    ?Sodium 135 - 145 mmol/L 138   137   128    ?Potassium 3.5 - 5.1 mmol/L 3.8   3.7   3.8    ?Chloride 98 - 111 mmol/L 99   97   92    ?CO2 22 - 32 mmol/L 26   33   27    ?Calcium 8.9 - 10.3 mg/dL 9.1   9.3   8.8    ?Total Protein 6.5 - 8.1 g/dL 7.6   7.3   7.3    ?Total Bilirubin 0.3 - 1.2 mg/dL 0.5   0.5   0.5    ?Alkaline Phos 38 - 126 U/L 62   59   67    ?AST 15 - 41 U/L _1 ?ALT 0 - 44 U/L _2 ? ?No results found for: HRC163 ?Lab Results  ?Component Value Date  ? WBC 8.1 07/24/2021  ? HGB 13.4 07/24/2021  ? HCT 42.8 07/24/2021  ? MCV 95.5 07/24/2021  ? PLT 145 (L) 07/24/2021  ? NEUTROABS 5.3 07/24/2021  ? ? ?  ASSESSMENT:  ?1.  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  Additional suspicious mass with calcifications in the inner right breast measuring up to 2.3 cm. ?-Right breast biopsy at 12:00-IDC, grade 2, ER/PR positive, Ki-67-5%, HER-2 negative ?-Right breast biopsy at 2:30-high-grade DCIS with focal necrosis and calcifications. ?-She does not want to have any surgical intervention. ?- Anastrozole started on 08/01/2020. ?  ?2.  Social/family history: ?-She lives at assisted living facility "[facility]".  She uses wheelchair for ambulation and is able to dress herself and feed herself. 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  Never smoker. ?-Twin sister had cancer.  Brother had cancer.  Another sister had breast cancer. ?  ?3.  Bone health: ?- DEXA scan on 08/31/2020 T score -1.8, osteopenia. ? ? ?PLAN:  ?1.  Stage I (T2 N0 M0) right breast IDC, ER/PR positive, HER-2 negative: ?- She is tolerating anastrozole very well. ?- Physical examination shows 2 cm mass in the right breast 12 o'clock position about 3 to 4 cm above the nipple.  No other masses palpable.  No adenopathy. ?- Reviewed ultrasound right breast from 07/24/2021: Dominant mass in the right breast at 12 o'clock position is 1.8 x 0.7 x 2.3 cm, unchanged from previous measurements.  Mass in the right breast at 2:30 position measures 0.3 x 0.3 x 0.3 cm (0.8 x 0.4 x 0.6 cm). ?- Would recommend continuing anastrozole at this time. ?- RTC 6 months for follow-up with repeat right breast ultrasound and labs. ?  ?2.  Bone health: ?- Vitamin D level is normal at 33.  Continue calcium and vitamin D supplements. ? ? ? ?Breast Cancer therapy associated bone loss: I have recommended calcium, Vitamin D and weight bearing exercises. ? ?Orders placed this encounter:  ?No orders of the defined types were placed in this encounter. ? ? ?The patient has a good understanding of the overall plan. She agrees with it. She will call with  any problems that may develop before the next visit here. ? ?Derek Jack, MD ?[facility] ?[phone] ? ? ?I, Thana Ates, am acting as a scribe for Dr. Derek Jack. ? ?I, Sree

## 2021-08-29 ENCOUNTER — Encounter: Payer: Self-pay | Admitting: Nurse Practitioner

## 2021-08-29 ENCOUNTER — Non-Acute Institutional Stay: Payer: Medicare Other | Admitting: Nurse Practitioner

## 2021-08-29 DIAGNOSIS — R635 Abnormal weight gain: Secondary | ICD-10-CM

## 2021-08-29 DIAGNOSIS — R5381 Other malaise: Secondary | ICD-10-CM

## 2021-08-29 DIAGNOSIS — Z515 Encounter for palliative care: Secondary | ICD-10-CM

## 2021-08-29 NOTE — Progress Notes (Signed)
? ? ?[facility] ?Community Palliative Care Consult Note ?Telephone: [phone]  ?Fax: [phone]  ? ? ?Date of encounter: 08/29/21 ?8:14 PM ?PATIENT NAME: Adriana Stevens ?[address] [place] [address]   ?[phone] (home)  ?DOB: 1926-02-04 ?MRN: [id number] ?PRIMARY CARE PROVIDER:    ?[facility] ? ?RESPONSIBLE PARTY:    ?Contact Information   ? ? Name Relation Home Work Mobile  ? [facility] Daughter [phone]    ? ?  ? ?I met face to face with patient in facility. Palliative Care was asked to follow this patient by consultation request of [facility] to address advance care planning and complex medical decision making. This is a follow up visit.                                  ?ASSESSMENT AND PLAN / RECOMMENDATIONS:  ?Symptom Management/Plan: ?1. Advance Care Planning; DNR; Ms. Dillon continues on Anastrozole for breast cancer, aggressive intervention at this time would not be eligible for Hospice services under Medicare benefit. ?  ?2. Weight gain; reviewed weights; discussed nutrition, foods she likes;  will continue to monitor,  ?  ?Current weights ?02/07/2021 weight 161.2 lbs at [facility] ?03/10/2021 weight 162.3 lbs ?04/09/2021 weight 162.3 lbs ?05/10/2021 weight 159.5 lbs ?06/10/2021 weight 159 lbs ?08/04/2021 weight 170.5 lbs;  ?3. Palliative care encounter; Palliative care encounter; Palliative medicine team will continue to support patient, patient's family, and medical team. Visit consisted of counseling and education dealing with the complex and emotionally intense issues of symptom management and palliative care in the setting of serious and potentially life-threatening illness ?  ?4. f/u 1 month for ongoing monitoring chronic disease progression, ongoing discussions complex medical decision making ?Follow up Palliative Care Visit: Palliative care will continue to follow for complex medical decision making, advance care planning, and clarification of goals. Return 4  weeks or prn. ? ?I spent 43 minutes providing this consultation starting at 2:30pm. More than 50% of the time in this consultation was spent in counseling and care coordination. ?PPS: 40% ? ?Chief Complaint: Follow up palliative consult for complex medical decision making ? ?HISTORY OF PRESENT ILLNESS:  Adriana Stevens is a 88 y.o. year old female  with multiple medical problems including Right breast cancer continues on Anastrozole, ischemic heart disease, h/o TIA, HTN, OA, HLD, blindness right eye, thrombocytopenia, h/o abnormal gait, insomnia. Reviewed updates with Lerry Liner NP.  I visited and observed Ms. Burdi at [facility] in dining room sitting at the table finishing her lunch. Ms. Tryon and I talked about purpose of pc visit. Ms. Davee in agreement. We talked symptoms, ros, appetite, weight loss, sleep improving. Praised Ms. Gonser for going to dining area for meals, interacting with other residents. We talked about her daily routine. No new changes or concerns.  Ms. Menor endorses it has becoming more difficult for her to move around in her room, bath, dress, eat with her right eye causing her more discomfort. We talked about coping strategies. We talked about things she likes to do, activities. We talked about no recent falls, wounds, hospitalizations, infections. We talked about family, support system. We talked about role pc in poc. Therapeutic listening, emotional support provided. Questions answered.  ? ?History obtained from review of EMR, discussion with primary team, facility staff/caregiver and/or Ms. Spray.  ?I reviewed available labs, medications, imaging, studies and related documents from the EMR.  Records reviewed and summarized above.  ? ?ROS ?General: NAD ?EYES: denies vision changes ?ENMT: denies dysphagia ?Cardiovascular: denies chest pain, denies DOE ?Pulmonary: denies cough, denies increased SOB ?Abdomen: endorses good appetite, denies constipation, endorses continence of  bowel ?GU: denies dysuria, endorses continence of urine ?MSK:  denies increased weakness,  no falls reported ?Skin: denies rashes or wounds ?Neurological: denies pain, denies insomnia ?Psych: Endorses positive mood ?Physical Exam: ?Current and past weights: ?Constitutional: NAD ?General: frail appearing, thin, pleasant elderly female ?EYES:  lids intact ?ENMT: oral mucous membranes moist ?CV: S1S2, RRR ?Pulmonary: LCTA, no increased work of breathing, no cough, room air ?Abdomen: soft and non tender ?MSK: w/c dependent ?Skin: warm and dry ?Neuro:  + generalized weakness,  no cognitive impairment ?Psych: non-anxious affect, Alert, oriented  ?Thank you for the opportunity to participate in the care of Adriana Stevens.  The palliative care team will continue to follow. Please call our office at [phone] if we can be of additional assistance.  ? ?Orvill Coulthard Z Jader Desai, NP  ?

## 2021-11-16 ENCOUNTER — Non-Acute Institutional Stay: Payer: Medicare Other | Admitting: Nurse Practitioner

## 2021-11-16 ENCOUNTER — Encounter: Payer: Self-pay | Admitting: Nurse Practitioner

## 2021-11-16 DIAGNOSIS — R5381 Other malaise: Secondary | ICD-10-CM

## 2021-11-16 DIAGNOSIS — Z515 Encounter for palliative care: Secondary | ICD-10-CM

## 2021-11-16 NOTE — Progress Notes (Signed)
  [facility] Consult Note Telephone: [phone]  Fax: [phone]    Date of encounter: 11/16/21 5:41 PM PATIENT NAME: Adriana Stevens [address] [place] [address]   [phone] (home)  DOB: 03/18/1926 MRN: [id number] PRIMARY CARE PROVIDER:    Kikel, Stephen, MD,  [address] [place] [address] [phone]  REFERRING PROVIDER:   Kikel, Stephen, MD [address] [place],  [place] [address] [phone]  RESPONSIBLE PARTY:    Contact Information     Name Relation Home Work Mobile   SLADE,MARY Daughter [phone]       I met face to face with patient in facility. Palliative Care was asked to follow this patient by consultation request of [facility] to address advance care planning and complex medical decision making. This is a follow up visit.                                  ASSESSMENT AND PLAN / RECOMMENDATIONS:  Symptom Management/Plan: 1. Advance Care Planning; DNR;    2. Weight gain; reviewed weights; discussed nutrition, foods she likes;  will continue to monitor, will requested current weight log   Current weights 02/07/2021 weight 161.2 lbs at [facility] 03/10/2021 weight 162.3 lbs 04/09/2021 weight 162.3 lbs 05/10/2021 weight 159.5 lbs 06/10/2021 weight 159 lbs 08/04/2021 weight 170.5 lbs;  Will request new weight;  3. Palliative care encounter; Palliative care encounter; Palliative medicine team will continue to support patient, patient's family, and medical team. Visit consisted of counseling and education dealing with the complex and emotionally intense issues of symptom management and palliative care in the setting of serious and potentially life-threatening illness   4. f/u 1 month for ongoing monitoring chronic disease progression, ongoing discussions complex medical decision making Follow up Palliative Care Visit: Palliative care will continue to follow for complex medical decision making,  advance care planning, and clarification of goals. Return 4 weeks or prn.   I spent 45 minutes providing this consultation starting at 11:30am. More than 50% of the time in this consultation was spent in counseling and care coordination. PPS: 40%   Chief Complaint: Follow up palliative consult for complex medical decision making   HISTORY OF PRESENT ILLNESS:  Adriana Stevens is a 88 y.o. year old female  with multiple medical problems including Right breast cancer continues on Anastrozole, ischemic heart disease, h/o TIA, HTN, OA, HLD, blindness right eye, thrombocytopenia, h/o abnormal gait, insomnia. Adriana Stevens requires assistance for transfers, mobility as she is in a w/c. Adriana Stevens requires assistance for ADL's, bathing, dressing, feeds herself with fair appetite per staff. No recent hospitalization, falls, wounds, infections per staff. No new changes or concerns per staff.  I visited and observed Adriana Stevens, she was sitting in the w/c in the hall waiting to go into the dining area. Adriana Stevens and I talked about purpose of pc visit. Asked Adriana Stevens if she wish to return to her room she declined. Adriana Stevens in agreement. We talked symptoms, ros, appetite, weight loss, sleep improving. We talked about her daily routine. No new changes or concerns.  Adriana Stevens endorses thing have been good, no new challenges. She is receiving more help with her adl's. She is enjoying her life. We talked about family, support system. We talked about role pc in poc. Therapeutic listening, emotional support provided. Questions answered.      History obtained from review of EMR, discussion with primary team, facility staff/caregiver and/or Adriana Stevens.  I reviewed available labs, medications, imaging, studies and related documents from the EMR.  Records reviewed and summarized above.    ROS 10 point system reviewed all negative except HPI Physical Exam: Constitutional: NAD General: frail appearing, thin, pleasant elderly  female EYES:  lids intact ENMT: oral mucous membranes moist CV: S1S2, RRR Pulmonary: LCTA, no increased work of breathing MSK: w/c dependent Skin: warm and dry Neuro:  + generalized weakness,  no cognitive impairment Psych: non-anxious affect, Alert, oriented   Thank you for the opportunity to participate in the care of Adriana Stevens.  The palliative care team will continue to follow. Please call our office at [phone] if we can be of additional assistance.    Z , NP    

## 2022-01-20 ENCOUNTER — Emergency Department (HOSPITAL_COMMUNITY): Payer: Medicare Other

## 2022-01-20 ENCOUNTER — Other Ambulatory Visit: Payer: Self-pay

## 2022-01-20 ENCOUNTER — Inpatient Hospital Stay (HOSPITAL_COMMUNITY)
Admission: EM | Admit: 2022-01-20 | Discharge: 2022-01-24 | DRG: 640 | Disposition: A | Discharge status: Skilled Nursing Facility | Payer: Medicare Other | Source: Skilled Nursing Facility | Attending: Internal Medicine | Admitting: Internal Medicine

## 2022-01-20 ENCOUNTER — Encounter (HOSPITAL_COMMUNITY): Payer: Self-pay | Admitting: Emergency Medicine

## 2022-01-20 DIAGNOSIS — R4182 Altered mental status, unspecified: Secondary | ICD-10-CM | POA: Diagnosis present

## 2022-01-20 DIAGNOSIS — F0280 Dementia in other diseases classified elsewhere without behavioral disturbance: Secondary | ICD-10-CM | POA: Diagnosis present

## 2022-01-20 DIAGNOSIS — Z20822 Contact with and (suspected) exposure to covid-19: Secondary | ICD-10-CM | POA: Diagnosis present

## 2022-01-20 DIAGNOSIS — Z91148 Patient's other noncompliance with medication regimen for other reason: Secondary | ICD-10-CM | POA: Diagnosis not present

## 2022-01-20 DIAGNOSIS — Z8673 Personal history of transient ischemic attack (TIA), and cerebral infarction without residual deficits: Secondary | ICD-10-CM

## 2022-01-20 DIAGNOSIS — Z809 Family history of malignant neoplasm, unspecified: Secondary | ICD-10-CM | POA: Diagnosis not present

## 2022-01-20 DIAGNOSIS — Z66 Do not resuscitate: Secondary | ICD-10-CM | POA: Diagnosis present

## 2022-01-20 DIAGNOSIS — R6520 Severe sepsis without septic shock: Secondary | ICD-10-CM | POA: Diagnosis not present

## 2022-01-20 DIAGNOSIS — K089 Disorder of teeth and supporting structures, unspecified: Secondary | ICD-10-CM | POA: Diagnosis not present

## 2022-01-20 DIAGNOSIS — E785 Hyperlipidemia, unspecified: Secondary | ICD-10-CM | POA: Diagnosis present

## 2022-01-20 DIAGNOSIS — H5461 Unqualified visual loss, right eye, normal vision left eye: Secondary | ICD-10-CM | POA: Diagnosis present

## 2022-01-20 DIAGNOSIS — A419 Sepsis, unspecified organism: Secondary | ICD-10-CM | POA: Diagnosis not present

## 2022-01-20 DIAGNOSIS — R6510 Systemic inflammatory response syndrome (SIRS) of non-infectious origin without acute organ dysfunction: Secondary | ICD-10-CM | POA: Diagnosis present

## 2022-01-20 DIAGNOSIS — I10 Essential (primary) hypertension: Secondary | ICD-10-CM | POA: Diagnosis present

## 2022-01-20 DIAGNOSIS — Z823 Family history of stroke: Secondary | ICD-10-CM

## 2022-01-20 DIAGNOSIS — Z993 Dependence on wheelchair: Secondary | ICD-10-CM | POA: Diagnosis not present

## 2022-01-20 DIAGNOSIS — E876 Hypokalemia: Secondary | ICD-10-CM | POA: Diagnosis present

## 2022-01-20 DIAGNOSIS — Z853 Personal history of malignant neoplasm of breast: Secondary | ICD-10-CM | POA: Diagnosis not present

## 2022-01-20 DIAGNOSIS — Z8249 Family history of ischemic heart disease and other diseases of the circulatory system: Secondary | ICD-10-CM

## 2022-01-20 DIAGNOSIS — H5440 Blindness, one eye, unspecified eye: Secondary | ICD-10-CM | POA: Diagnosis present

## 2022-01-20 DIAGNOSIS — E872 Acidosis, unspecified: Secondary | ICD-10-CM | POA: Diagnosis present

## 2022-01-20 DIAGNOSIS — R413 Other amnesia: Secondary | ICD-10-CM | POA: Diagnosis not present

## 2022-01-20 DIAGNOSIS — G9341 Metabolic encephalopathy: Secondary | ICD-10-CM | POA: Diagnosis not present

## 2022-01-20 DIAGNOSIS — G928 Other toxic encephalopathy: Secondary | ICD-10-CM | POA: Diagnosis present

## 2022-01-20 DIAGNOSIS — G9340 Encephalopathy, unspecified: Secondary | ICD-10-CM | POA: Diagnosis not present

## 2022-01-20 DIAGNOSIS — Z7189 Other specified counseling: Secondary | ICD-10-CM | POA: Diagnosis not present

## 2022-01-20 DIAGNOSIS — E860 Dehydration: Secondary | ICD-10-CM | POA: Diagnosis present

## 2022-01-20 DIAGNOSIS — Z515 Encounter for palliative care: Secondary | ICD-10-CM | POA: Diagnosis not present

## 2022-01-20 HISTORY — DX: Essential (primary) hypertension: I10

## 2022-01-20 HISTORY — DX: Cerebral infarction, unspecified: I63.9

## 2022-01-20 HISTORY — DX: Hyperlipidemia, unspecified: E78.5

## 2022-01-20 HISTORY — DX: Transient cerebral ischemic attack, unspecified: G45.9

## 2022-01-20 HISTORY — DX: Unspecified osteoarthritis, unspecified site: M19.90

## 2022-01-20 HISTORY — DX: Unspecified glaucoma: H40.9

## 2022-01-20 LAB — COMPREHENSIVE METABOLIC PANEL
ALT: 10 U/L (ref 0–44)
AST: 20 U/L (ref 15–41)
Albumin: 4.2 g/dL (ref 3.5–5.0)
Alkaline Phosphatase: 61 U/L (ref 38–126)
Anion gap: 14 (ref 5–15)
BUN: 17 mg/dL (ref 8–23)
CO2: 27 mmol/L (ref 22–32)
Calcium: 9.5 mg/dL (ref 8.9–10.3)
Chloride: 97 mmol/L — ABNORMAL LOW (ref 98–111)
Creatinine, Ser: 1.24 mg/dL — ABNORMAL HIGH (ref 0.44–1.00)
GFR, Estimated: 40 mL/min — ABNORMAL LOW (ref >60)
Glucose, Bld: 152 mg/dL — ABNORMAL HIGH (ref 70–99)
Potassium: 3.7 mmol/L (ref 3.5–5.1)
Sodium: 138 mmol/L (ref 135–145)
Total Bilirubin: 0.7 mg/dL (ref 0.3–1.2)
Total Protein: 8.1 g/dL (ref 6.5–8.1)

## 2022-01-20 LAB — URINALYSIS, ROUTINE W REFLEX MICROSCOPIC
Bacteria, UA: NONE SEEN
Bilirubin Urine: NEGATIVE
Glucose, UA: NEGATIVE mg/dL
Ketones, ur: 5 mg/dL — AB
Leukocytes,Ua: NEGATIVE
Nitrite: NEGATIVE
Protein, ur: >=300 mg/dL — AB
Specific Gravity, Urine: 1.01 (ref 1.005–1.030)
pH: 6 (ref 5.0–8.0)

## 2022-01-20 LAB — CBC WITH DIFFERENTIAL/PLATELET
Abs Immature Granulocytes: 0.04 10*3/uL (ref 0.00–0.07)
Basophils Absolute: 0 10*3/uL (ref 0.0–0.1)
Basophils Relative: 0 %
Eosinophils Absolute: 0.1 10*3/uL (ref 0.0–0.5)
Eosinophils Relative: 1 %
HCT: 45 % (ref 36.0–46.0)
Hemoglobin: 14.1 g/dL (ref 12.0–15.0)
Immature Granulocytes: 0 %
Lymphocytes Relative: 17 %
Lymphs Abs: 1.8 10*3/uL (ref 0.7–4.0)
MCH: 30 pg (ref 26.0–34.0)
MCHC: 31.3 g/dL (ref 30.0–36.0)
MCV: 95.7 fL (ref 80.0–100.0)
Monocytes Absolute: 0.6 10*3/uL (ref 0.1–1.0)
Monocytes Relative: 6 %
Neutro Abs: 7.8 10*3/uL — ABNORMAL HIGH (ref 1.7–7.7)
Neutrophils Relative %: 76 %
Platelets: 192 10*3/uL (ref 150–400)
RBC: 4.7 MIL/uL (ref 3.87–5.11)
RDW: 12.7 % (ref 11.5–15.5)
WBC: 10.3 10*3/uL (ref 4.0–10.5)
nRBC: 0 % (ref 0.0–0.2)

## 2022-01-20 LAB — BLOOD GAS, VENOUS
Acid-Base Excess: 7.1 mmol/L — ABNORMAL HIGH (ref 0.0–2.0)
Bicarbonate: 33.9 mmol/L — ABNORMAL HIGH (ref 20.0–28.0)
Drawn by: 27160
O2 Saturation: 44.7 %
Patient temperature: 36.5
pCO2, Ven: 55 mmHg (ref 44–60)
pH, Ven: 7.4 (ref 7.25–7.43)
pO2, Ven: <31 mmHg — CL (ref 32–45)

## 2022-01-20 LAB — RESP PANEL BY RT-PCR (FLU A&B, COVID) ARPGX2
Influenza A by PCR: NEGATIVE
Influenza B by PCR: NEGATIVE
SARS Coronavirus 2 by RT PCR: NEGATIVE

## 2022-01-20 LAB — LACTIC ACID, PLASMA
Lactic Acid, Venous: 4.1 mmol/L (ref 0.5–1.9)
Lactic Acid, Venous: 3.6 mmol/L (ref 0.5–1.9)
Lactic Acid, Venous: 1.7 mmol/L (ref 0.5–1.9)

## 2022-01-20 LAB — BRAIN NATRIURETIC PEPTIDE: B Natriuretic Peptide: 62 pg/mL (ref 0.0–100.0)

## 2022-01-20 LAB — PROTIME-INR
INR: 1 (ref 0.8–1.2)
Prothrombin Time: 13.5 seconds (ref 11.4–15.2)

## 2022-01-20 LAB — APTT: aPTT: 22 seconds — ABNORMAL LOW (ref 24–36)

## 2022-01-20 LAB — PROCALCITONIN: Procalcitonin: <0.1 ng/mL

## 2022-01-20 MED ORDER — METRONIDAZOLE 500 MG/100ML IV SOLN
500.0000 mg | Freq: Two times a day (BID) | INTRAVENOUS | Status: DC
  Administered 2022-01-20 – 2022-01-23 (×6): 500 mg via INTRAVENOUS
  Filled 2022-01-20 (×6): qty 100

## 2022-01-20 MED ORDER — LABETALOL HCL 5 MG/ML IV SOLN
10.0000 mg | INTRAVENOUS | Status: DC | PRN
  Administered 2022-01-20: 10 mg via INTRAVENOUS
  Filled 2022-01-20: qty 4

## 2022-01-20 MED ORDER — SODIUM CHLORIDE 0.9 % IV SOLN: INTRAVENOUS | Status: AC

## 2022-01-20 MED ORDER — AMLODIPINE BESYLATE 5 MG PO TABS
10.0000 mg | ORAL_TABLET | Freq: Every day | ORAL | Status: DC
  Administered 2022-01-20 – 2022-01-24 (×5): 10 mg via ORAL
  Filled 2022-01-20 (×5): qty 2

## 2022-01-20 MED ORDER — IOHEXOL 350 MG/ML SOLN
80.0000 mL | Freq: Once | INTRAVENOUS | Status: AC | PRN
  Administered 2022-01-20: 80 mL via INTRAVENOUS

## 2022-01-20 MED ORDER — SODIUM CHLORIDE 0.9 % IV SOLN
1.0000 g | Freq: Once | INTRAVENOUS | Status: AC
  Administered 2022-01-20: 1 g via INTRAVENOUS
  Filled 2022-01-20: qty 10

## 2022-01-20 MED ORDER — ONDANSETRON HCL 4 MG/2ML IJ SOLN: 4.0000 mg | Freq: Four times a day (QID) | INTRAMUSCULAR | Status: DC | PRN

## 2022-01-20 MED ORDER — ACETAMINOPHEN 650 MG RE SUPP: 650.0000 mg | Freq: Four times a day (QID) | RECTAL | Status: DC | PRN

## 2022-01-20 MED ORDER — LABETALOL HCL 5 MG/ML IV SOLN: 10.0000 mg | INTRAVENOUS | Status: DC | PRN

## 2022-01-20 MED ORDER — VANCOMYCIN HCL 1250 MG/250ML IV SOLN: 1250.0000 mg | INTRAVENOUS | Status: DC

## 2022-01-20 MED ORDER — ZIPRASIDONE MESYLATE 20 MG IM SOLR
10.0000 mg | Freq: Once | INTRAMUSCULAR | Status: AC
  Administered 2022-01-20: 10 mg via INTRAMUSCULAR
  Filled 2022-01-20: qty 20

## 2022-01-20 MED ORDER — VANCOMYCIN HCL 500 MG/100ML IV SOLN
500.0000 mg | Freq: Once | INTRAVENOUS | Status: AC
  Administered 2022-01-20: 500 mg via INTRAVENOUS
  Filled 2022-01-20 (×2): qty 100

## 2022-01-20 MED ORDER — LACTATED RINGERS IV BOLUS
1000.0000 mL | Freq: Once | INTRAVENOUS | Status: AC
  Administered 2022-01-20: 1000 mL via INTRAVENOUS

## 2022-01-20 MED ORDER — POLYETHYLENE GLYCOL 3350 17 G PO PACK: 17.0000 g | PACK | Freq: Every day | ORAL | Status: DC | PRN

## 2022-01-20 MED ORDER — ENOXAPARIN SODIUM 30 MG/0.3ML IJ SOSY
30.0000 mg | PREFILLED_SYRINGE | INTRAMUSCULAR | Status: DC
  Administered 2022-01-20: 30 mg via SUBCUTANEOUS
  Filled 2022-01-20: qty 0.3

## 2022-01-20 MED ORDER — ONDANSETRON HCL 4 MG PO TABS: 4.0000 mg | ORAL_TABLET | Freq: Four times a day (QID) | ORAL | Status: DC | PRN

## 2022-01-20 MED ORDER — SODIUM CHLORIDE 0.9 % IV SOLN
2.0000 g | INTRAVENOUS | Status: DC
  Administered 2022-01-20: 2 g via INTRAVENOUS
  Filled 2022-01-20: qty 12.5

## 2022-01-20 MED ORDER — SODIUM CHLORIDE 0.9 % IV BOLUS
500.0000 mL | Freq: Once | INTRAVENOUS | Status: AC
  Administered 2022-01-20: 500 mL via INTRAVENOUS

## 2022-01-20 MED ORDER — HYDRALAZINE HCL 20 MG/ML IJ SOLN
10.0000 mg | Freq: Once | INTRAMUSCULAR | Status: AC
  Administered 2022-01-20: 10 mg via INTRAVENOUS
  Filled 2022-01-20: qty 1

## 2022-01-20 MED ORDER — ONDANSETRON HCL 4 MG/2ML IJ SOLN
4.0000 mg | Freq: Once | INTRAMUSCULAR | Status: AC
  Administered 2022-01-20: 4 mg via INTRAVENOUS
  Filled 2022-01-20: qty 2

## 2022-01-20 MED ORDER — STERILE WATER FOR INJECTION IJ SOLN
INTRAMUSCULAR | Status: AC
  Administered 2022-01-20: 10 mL
  Filled 2022-01-20: qty 10

## 2022-01-20 MED ORDER — ACETAMINOPHEN 325 MG PO TABS: 650.0000 mg | ORAL_TABLET | Freq: Four times a day (QID) | ORAL | Status: DC | PRN

## 2022-01-20 MED ORDER — VANCOMYCIN HCL IN DEXTROSE 1-5 GM/200ML-% IV SOLN
1000.0000 mg | Freq: Once | INTRAVENOUS | Status: AC
  Administered 2022-01-20: 1000 mg via INTRAVENOUS
  Filled 2022-01-20: qty 200

## 2022-01-20 NOTE — ED Provider Notes (Signed)
[facility] EMERGENCY DEPARTMENT Provider Note   CSN: [id number] Arrival date & time: 01/20/22  1131     History  Chief Complaint  Patient presents with   Altered Mental Status    Lissie B Goehring is a 88 y.o. female.  Patient as above with significant medical history as below, including History of TIA, hypertension, OSA, HLD, insomnia, right-sided breast cancer, DNR who presents to the ED with complaint of ams.  Patient resides at [facility].  They report patient having malodorous urine since Wednesday, progressive worsening mental status over the past few days.  Vomiting.  Reduced p.o. intake last 24 hours.  Intermittent agitation.  Worsened from her baseline.  No falls.  No diarrhea reported.  No fevers or chills reported.  Patient is a very poor historian secondary to dementia.  Pt did vomit during my assessment, copious, not projectile, non bloody   Past Medical History:  Diagnosis Date   Cerebral infarction ([place])    Glaucoma    Hyperlipemia    Hypertension    Osteoarthritis    TIA (transient ischemic attack)     Past Surgical History:  Procedure Laterality Date   EYE SURGERY N/A      The history is provided by the patient and the EMS personnel. The history is limited by the condition of the patient. No language interpreter was used.  Altered Mental Status      Home Medications Prior to Admission medications   Medication Sig Start Date End Date Taking? Authorizing Provider  acetaminophen (TYLENOL) 500 MG tablet Take 1,000 mg by mouth 3 (three) times daily.    [provider]  alum & mag hydroxide-simeth (MAALOX/MYLANTA) 200-200-20 MG/5ML suspension Take 30 mLs by mouth every 6 (six) hours as needed for indigestion or heartburn.    [provider]  Amino Acids-Protein Hydrolys (FEEDING SUPPLEMENT, PRO-STAT 64,) LIQD Take 30 mLs by mouth daily.    [provider]  amLODipine (NORVASC) 10 MG tablet Take 10 mg by mouth daily.     [provider]  anastrozole (ARIMIDEX) 1 MG tablet Take 1 tablet (1 mg total) by mouth daily. 08/01/20   Derek Jack, MD  Ascorbic Acid 500 MG/15ML LIQD 1 tablet    [provider]  ASPERCREME LIDOCAINE 4 % PTCH Apply 1 patch topically daily. 04/03/21   [provider]  diclofenac Sodium (VOLTAREN) 1 % GEL See admin instructions.    [provider]  docusate sodium (COLACE) 100 MG capsule Take 100 mg by mouth daily.    [provider]  famotidine (PEPCID) 20 MG tablet Take 20 mg by mouth 2 (two) times daily. 06/26/20   [provider]  gabapentin (NEURONTIN) 100 MG capsule Take 100 mg by mouth at bedtime. 11/30/20   [provider]  hydroxypropyl methylcellulose / hypromellose (ISOPTO TEARS / GONIOVISC) 2.5 % ophthalmic solution 1 drop 4 (four) times daily.    [provider]  latanoprost (XALATAN) 0.005 % ophthalmic solution SMARTSIG:In Eye(s) 07/10/20   [provider]  loteprednol (LOTEMAX) 0.5 % ophthalmic suspension SMARTSIG:In Eye(s) 08/10/20   [provider]  Magnesium 400 MG TABS Take 1 tablet by mouth at bedtime.    [provider]  magnesium hydroxide (MILK OF MAGNESIA) 400 MG/5ML suspension Take 30 mLs by mouth daily as needed for mild constipation.    [provider]  melatonin 3 MG TABS tablet Take 3 mg by mouth at bedtime.    [provider]  Multiple Vitamin (  TAB-A-VITE) TABS Take 1 tablet by mouth daily. 02/22/21   [provider]  nitroGLYCERIN (NITROSTAT) 0.4 MG SL tablet Place 0.4 mg under the tongue every 5 (five) minutes as needed for chest pain.    [provider]  olmesartan-hydrochlorothiazide (BENICAR HCT) 40-12.5 MG tablet Take 1 tablet by mouth daily. 08/25/20   [provider]  potassium chloride SA (KLOR-CON) 20 MEQ tablet Take 20 mEq by mouth daily. 08/25/20   [provider]  senna (SENOKOT) 8.6 MG TABS tablet Take  2 tablets by mouth at bedtime.    [provider]  sodium chloride (MURO 128) 5 % ophthalmic solution SMARTSIG:In Eye(s) 05/15/21   [provider]  SYSTANE 0.4-0.3 % SOLN Apply to eye. 05/16/21   [provider]  traZODone (DESYREL) 50 MG tablet Take 50 mg by mouth at bedtime as needed for sleep.    [provider]      Allergies    Patient has no known allergies.    Review of Systems   Review of Systems  Unable to perform ROS: Dementia    Physical Exam Updated Vital Signs BP (!) 176/87   Pulse (!) 112   Temp 97.6 F (36.4 C) (Axillary)   Resp 20   Ht '5\' 5"'$  (1.651 m)   SpO2 92%   BMI 28.36 kg/m  Physical Exam Vitals and nursing note reviewed.  Constitutional:      General: She is not in acute distress.    Appearance: Normal appearance.  HENT:     Head: Normocephalic and atraumatic. No raccoon eyes, Battle's sign, right periorbital erythema or left periorbital erythema.     Jaw: There is normal jaw occlusion. No trismus.     Right Ear: External ear normal.     Left Ear: External ear normal.     Nose: Nose normal.     Mouth/Throat:     Mouth: Mucous membranes are moist.  Eyes:     General: No scleral icterus. Neck:     Comments: Moving her neck freely spontaneously Cardiovascular:     Rate and Rhythm: Normal rate and regular rhythm.     Pulses: Normal pulses.     Heart sounds: Normal heart sounds.  Pulmonary:     Effort: Pulmonary effort is normal. No respiratory distress.     Breath sounds: Normal breath sounds. No stridor.  Abdominal:     General: Abdomen is flat.     Tenderness: There is no abdominal tenderness.  Musculoskeletal:        General: Normal range of motion.     Cervical back: Full passive range of motion without pain and normal range of motion.     Right lower leg: No edema.     Left lower leg: No edema.  Skin:    General: Skin is warm and dry.     Capillary Refill: Capillary refill takes less than 2 seconds.   Neurological:     Mental Status: She is alert. She is confused.     GCS: GCS eye subscore is 4. GCS verbal subscore is 4. GCS motor subscore is 5.     Comments: Moving extremities spontaneously.  Intermittent following commands.    Psychiatric:        Mood and Affect: Mood normal.        Behavior: Behavior normal.     ED Results / Procedures / Treatments   Labs (all labs ordered are listed, but only abnormal results are displayed) Labs Reviewed  LACTIC ACID, PLASMA - Abnormal; Notable for the following components:      Result Value   Lactic Acid, Venous 4.1 (*)    All other components within normal limits  LACTIC ACID, PLASMA - Abnormal; Notable for the following components:   Lactic Acid, Venous 3.6 (*)    All other components within normal limits  COMPREHENSIVE METABOLIC PANEL - Abnormal; Notable for the following components:   Chloride 97 (*)    Glucose, Bld 152 (*)    Creatinine, Ser 1.24 (*)    GFR, Estimated 40 (*)    All other components within normal limits  CBC WITH DIFFERENTIAL/PLATELET - Abnormal; Notable for the following components:   Neutro Abs 7.8 (*)    All other components within normal limits  APTT - Abnormal; Notable for the following components:   aPTT 22 (*)    All other components within normal limits  URINALYSIS, ROUTINE W REFLEX MICROSCOPIC - Abnormal; Notable for the following components:   Hgb urine dipstick SMALL (*)    Ketones, ur 5 (*)    Protein, ur >=300 (*)    All other components within normal limits  BLOOD GAS, VENOUS - Abnormal; Notable for the following components:   pO2, Ven <31 (*)    Bicarbonate 33.9 (*)    Acid-Base Excess 7.1 (*)    All other components within normal limits  CULTURE, BLOOD (ROUTINE X 2)  CULTURE, BLOOD (ROUTINE X 2)  RESP PANEL BY RT-PCR (FLU A&B, COVID) ARPGX2  URINE CULTURE  PROTIME-INR  BRAIN NATRIURETIC PEPTIDE    EKG EKG Interpretation  Date/Time:  Sunday January 20 2022 13:44:49  EDT Ventricular Rate:  105 PR Interval:  37 QRS Duration: 150 QT Interval:  327 QTC Calculation: 433 R Axis:   -77 Text Interpretation: Sinus tachycardia Right atrial enlargement Nonspecific IVCD with LAD LVH with secondary repolarization abnormality Artifact in lead(s) I II III aVR aVL aVF V1 V2 V3 V4 V5 V6 Confirmed by Noemi Chapel [phone]) on 01/20/2022 3:27:21 PM  Radiology CT Angio Chest/Abd/Pel for Dissection W and/or Wo Contrast  Result Date: 01/20/2022 CLINICAL DATA:  Evaluate for aortic dissection. EXAM: CT ANGIOGRAPHY CHEST, ABDOMEN AND PELVIS TECHNIQUE: Non-contrast CT of the chest was initially obtained. Multidetector CT imaging through the chest, abdomen and pelvis was performed using the standard protocol during bolus administration of intravenous contrast. Multiplanar reconstructed images and MIPs were obtained and reviewed to evaluate the vascular anatomy. RADIATION DOSE REDUCTION: This exam was performed according to the departmental dose-optimization program which includes automated exposure control, adjustment of the mA and/or kV according to patient size and/or use of iterative reconstruction technique. CONTRAST:  16m OMNIPAQUE IOHEXOL 350 MG/ML SOLN COMPARISON:  None Available. FINDINGS: CTA CHEST FINDINGS Cardiovascular: Preferential opacification of the thoracic aorta. No evidence of thoracic aortic aneurysm or dissection. Normal heart size. No pericardial effusion. Aortic atherosclerosis and 3 vessel coronary artery calcifications. Mediastinum/Nodes: No enlarged mediastinal, hilar, or axillary lymph nodes. Thyroid gland, trachea, and esophagus demonstrate no significant findings. Lungs/Pleura: No pleural effusion or airspace consolidation. There is subsegmental atelectasis identified involving the anterior right middle lobe, image 79/12. Scar versus subsegmental atelectasis within the anterior right lower lobe noted. Musculoskeletal: No acute or suspicious osseous findings.  Spondylosis identified in the thoracic spine. Review of the MIP images confirms the above findings. CTA ABDOMEN AND PELVIS FINDINGS VASCULAR Aorta: Infra renal abdominal aortic aneurysm is identified measuring 3.8 cm in maximum AP diameter. There is eccentric mural thrombus within the aneurysmal dilated  aorta measuring approximately 1.8 cm in thickness. After scratch set calcified at scratch set atherosclerotic calcifications noted throughout the aorta. No signs of dissection or significant stenosis. Celiac: Calcified plaque at the origin of the celiac artery results in approximately 50% stenosis. SMA: Patent without evidence of aneurysm, dissection, vasculitis or significant stenosis. Renals: Calcified plaque at the origin of the right renal artery results in approximately 40% stenosis. Left renal artery appears patent without significant stenosis. IMA: Patent without evidence of aneurysm, dissection, vasculitis or significant stenosis. Inflow: Patent without evidence of aneurysm, dissection, vasculitis or significant stenosis. Veins: No obvious venous abnormality within the limitations of this arterial phase study. Review of the MIP images confirms the above findings. NON-VASCULAR Hepatobiliary: No focal liver abnormality is seen. No gallstones, gallbladder wall thickening, or biliary dilatation. Pancreas: Unremarkable. No pancreatic ductal dilatation or surrounding inflammatory changes. Spleen: Normal in size without focal abnormality. Adrenals/Urinary Tract: Normal adrenal glands. Bosniak class 1 cyst arises off the upper pole of the right kidney measuring 4.2 cm. Bosniak class 1 cyst arising off the inferior pole of the left kidney measures 2.4 cm. Additional too small to reliably characterize low-attenuation kidney lesions are identified bilaterally compatible with Bosniak class 2 cyst. No follow-up imaging of these kidney lesions recommended. No kidney stones identified bilaterally. No suspicious mass or  hydronephrosis. Urinary bladder is unremarkable. Stomach/Bowel: Indeterminate partially calcified exophytic lesion is noted along the greater curvature of the stomach with possible noncalcified in the luminal component. This measures 1.7 x 1.3 cm and is of uncertain clinical significance. The appendix is visualized and appears normal. No bowel wall thickening, inflammation, or distension. Lymphatic: No abdominopelvic adenopathy. Reproductive: Uterus and bilateral adnexa are unremarkable. Other: No free fluid or fluid collections. Musculoskeletal: Right-sided sacroiliitis is identified. Multilevel degenerative disc disease is identified throughout the lumbar spine. No acute or suspicious osseous findings. Bilateral hip osteoarthritis, right greater than left. Review of the MIP images confirms the above findings. IMPRESSION: 1. No evidence for aortic dissection. 2. 3.8 cm infrarenal abdominal aortic aneurysm. Recommend follow-up every 2 years. Reference: J Am Coll Radiol 9798;92:119-417. 3. Right-sided sacroiliitis. 4. Small, indeterminate partially calcified exophytic lesion is noted along the greater curvature of the stomach with possible noncalcified in the luminal component. This is of uncertain clinical significance, but is favored to represent a chronic abnormality. Consider follow-up with gastroenterology. 5. Aortic Atherosclerosis (ICD10-I70.0). Electronically Signed   By: Kerby Moors M.D.   On: 01/20/2022 15:06   CT Head Wo Contrast  Result Date: 01/20/2022 CLINICAL DATA:  Delirium EXAM: CT HEAD WITHOUT CONTRAST TECHNIQUE: Contiguous axial images were obtained from the base of the skull through the vertex without intravenous contrast. RADIATION DOSE REDUCTION: This exam was performed according to the departmental dose-optimization program which includes automated exposure control, adjustment of the mA and/or kV according to patient size and/or use of iterative reconstruction technique. COMPARISON:   None Available. FINDINGS: Brain: Generalized atrophy. Moderate white matter hypodensity in the periventricular deep white matter bilaterally. Negative for acute infarct, hemorrhage, mass. Empty sella with enlargement of the sella filled with CSF. Vascular: Negative for hyperdense vessel Skull: Negative Sinuses/Orbits: Chronic fracture right maxillary sinus. Mucosal edema with calcification right maxillary sinus. Remaining sinuses clear. Bilateral cataract extraction Other: None IMPRESSION: Atrophy and chronic white matter changes.  No acute abnormality. Electronically Signed   By: Franchot Gallo M.D.   On: 01/20/2022 14:47   DG Chest Portable 1 View  Result Date: 01/20/2022 CLINICAL DATA:  Possible sepsis EXAM: PORTABLE  CHEST 1 VIEW COMPARISON:  None Available. FINDINGS: Transverse diameter of heart is increased. There is aneurysmal dilation of thoracic aorta. There are no signs of pulmonary edema or focal pulmonary consolidation. Small linear densities are seen in lower lung fields. There is no significant pleural effusion or pneumothorax. Degenerative changes are noted in right shoulder. IMPRESSION: There is tortuosity and aneurysmal dilation of thoracic aorta. There are no signs of pulmonary edema or focal pulmonary consolidation. Small linear densities in the lower lung fields suggest scarring or subsegmental atelectasis. Electronically Signed   By: Elmer Picker M.D.   On: 01/20/2022 13:13    Procedures .Critical Care  Performed by: Jeanell Sparrow, DO Authorized by: Jeanell Sparrow, DO   Critical care provider statement:    Critical care time (minutes):  44   Critical care time was exclusive of:  Separately billable procedures and treating other patients   Critical care was necessary to treat or prevent imminent or life-threatening deterioration of the following conditions:  Sepsis   Critical care was time spent personally by me on the following activities:  Development of treatment plan  with patient or surrogate, discussions with consultants, evaluation of patient's response to treatment, examination of patient, ordering and review of laboratory studies, ordering and review of radiographic studies, ordering and performing treatments and interventions, pulse oximetry, re-evaluation of patient's condition, review of old charts and obtaining history from patient or surrogate   Care discussed with: admitting provider       Medications Ordered in ED Medications  sodium chloride 0.9 % bolus 500 mL (has no administration in time range)  vancomycin (VANCOCIN) IVPB 1000 mg/200 mL premix (has no administration in time range)  ondansetron (ZOFRAN) injection 4 mg (4 mg Intravenous Given 01/20/22 1220)  ziprasidone (GEODON) injection 10 mg (10 mg Intramuscular Given 01/20/22 1227)  lactated ringers bolus 1,000 mL (0 mLs Intravenous Stopped 01/20/22 1446)  sterile water (preservative free) injection (10 mLs  Given 01/20/22 1227)  cefTRIAXone (ROCEPHIN) 1 g in sodium chloride 0.9 % 100 mL IVPB (0 g Intravenous Stopped 01/20/22 1430)  lactated ringers bolus 1,000 mL (1,000 mLs Intravenous New Bag/Given 01/20/22 1450)  iohexol (OMNIPAQUE) 350 MG/ML injection 80 mL (80 mLs Intravenous Contrast Given 01/20/22 1430)  hydrALAZINE (APRESOLINE) injection 10 mg (10 mg Intravenous Given 01/20/22 1455)    ED Course/ Medical Decision Making/ A&P Clinical Course as of 01/20/22 1556  Sun Jan 20, 2022  1534 LA is elevated, pt with AMS, tachycardia, febrile, meets for SIRS; unclear source of infection. ABX given, sepsis bundle ordered  [SG]    Clinical Course User Index [SG] Jeanell Sparrow, DO                           Medical Decision Making Amount and/or Complexity of Data Reviewed Labs: ordered. Radiology: ordered. ECG/medicine tests: ordered.  Risk Prescription drug management. Decision regarding hospitalization.   This patient presents to the ED with chief complaint(s) of AMS, malodorous urine  with pertinent past medical history of [place], DNR, as above which further complicates the presenting complaint. The complaint involves an extensive differential diagnosis and also carries with it a high risk of complications and morbidity.    Differential diagnoses for altered mental status includes but is not exclusive to alcohol, illicit or prescription medications, intracranial pathology such as stroke, intracerebral hemorrhage, fever or infectious causes including sepsis, hypoxemia, uremia, trauma, endocrine related disorders such as diabetes, hypoglycemia, thyroid-related diseases,  etc.  . Serious etiologies were considered.   The initial plan is to screening labs/imaging/ivf/geodon given agitation to help faciliate imaging and further workup. Sepsis bundle    Additional history obtained: Additional history obtained from family and EMS  Records reviewed Primary Care Documents and home meds, prior labs and imaging  Independent labs interpretation:  The following labs were independently interpreted:  Lactic acid elevated, downtrending after IV fluids. WBC is not elevated, pH is stable, creatinine stable, BMP stable, RVP negative urinalysis without infection   Independent visualization of imaging: - I independently visualized the following imaging with scope of interpretation limited to determining acute life threatening conditions related to emergency care: Chest x-ray, CT head, CT dissection protocol, which revealed sacroillitis, no dissection, no obvious source of infection  Cardiac monitoring was reviewed and interpreted by myself which shows sinus tachy  Treatment and Reassessment: Sepsis bundle, IV fluids, antibiotics, Zofran Also given hydralazine >> HR Improving, no further emesis  Consultation: - Consulted or discussed management/test interpretation w/ external professional: n/a  Consideration for admission or further workup: Admission was considered   88 year old  female here to the ED with malodorous urine, AMS.  She has dementia, DNR, nursing home resident.  Patient signed out to incoming EDP pending admission.  Lactic acid is downtrending, patient has SIRS, no clear source infection.  Continue broad-spectrum antibiotics.  IV fluids, recommend admission. Signed out to Dr Sabra Heck pending admission.   Social Determinants of health: Social History   Tobacco Use   Smoking status: Never   Smokeless tobacco: Never  Substance Use Topics   Alcohol use: No    Alcohol/week: 0.0 standard drinks of alcohol   Drug use: No            Final Clinical Impression(s) / ED Diagnoses Final diagnoses:  Sepsis, due to unspecified organism, unspecified whether acute organ dysfunction present ([place])  Altered mental status, unspecified altered mental status type    Rx / DC Orders ED Discharge Orders     None         Jeanell Sparrow, DO 01/20/22 1556

## 2022-01-20 NOTE — Progress Notes (Signed)
Pharmacy Antibiotic Note  Adriana Stevens is a 88 y.o. female admitted on 01/20/2022 with  unknown source .  Pharmacy has been consulted for Vancomycin and cefepime dosing.  Plan: Cefepime 2gm IV q24h Vancomycin '1500mg'$  total loading dose('1000mg'$  +additional '500mg'$ ) then '1250mg'$  mg IV Q 48 hrs. Goal AUC 400-550. Expected AUC: 512 SCr used: 1.24  F/U cxs and clinical progress Monitor V/S, labs, and levels as indicated  Height: '5\' 5"'$  (165.1 cm) Weight: 74.8 kg (165 lb) IBW/kg (Calculated) : 57  Temp (24hrs), Avg:99.2 F (37.3 C), Min:97.6 F (36.4 C), Max:100.7 F (38.2 C)  Recent Labs  Lab 01/20/22 1230 01/20/22 1240 01/20/22 1450  WBC 10.3  --   --   CREATININE 1.24*  --   --   LATICACIDVEN  --  4.1* 3.6*    Estimated Creatinine Clearance: 28.1 mL/min (A) (by C-G formula based on SCr of 1.24 mg/dL (H)).    No Known Allergies  Antimicrobials this admission: Vancomycin 9/17 >>  Cefepime 9/17 >>   Microbiology results: 9/17 BCx: pending 9/17 UCx: pending   MRSA PCR:   Thank you for allowing pharmacy to be a part of this patient's care.  Isac Sarna, BS Pharm D, BCPS Clinical Pharmacist 01/20/2022 5:46 PM

## 2022-01-20 NOTE — ED Notes (Signed)
Date and time results received: 01/20/22 '@1330'$  (use smartphrase ".now" to insert current time)  Test: Lactic Acid Critical Value: 4.1  Name of Provider Notified: Dr Wynona Dove  Orders Received? Or Actions Taken?: Orders Received - See Orders for details

## 2022-01-20 NOTE — H&P (Addendum)
History and Physical    Adriana Stevens GLO:[id number] DOB: 07/26/1925 DOA: 01/20/2022  PCP: Vidal Schwalbe, MD   Patient coming from: [facility]  I have personally briefly reviewed patient's old medical records in [place]  Chief Complaint: AMS  HPI: Adriana Stevens is a 88 y.o. female with medical history significant for HTN, Dementia, blind. [facility] of altered mental status, confusion, and reports that patient's urine had a strong smell, symptoms have been ongoing over the past 5 days.  Reportedly patient was somnolent to obtunded. Also with vomiting and poor oral intake.  Per ED provider, patient is blind, and does not talk.  At the time of my evaluation, patient is awake, she says 1 word answers yes to all my questions, but unable to give further details to answers.  I talked to patient's daughter Adriana Stevens on the phone, she tells me that she is not aware of the diagnosis of dementia, she tells me at baseline patient has mild memory problems, but she can talk and answer questions.  Patient is wheelchair-bound.  She reports she called her mother yesterday on the phone and her mother appeared confused, repeating questions and answers.  She believes over the past 5 days patient has not been doing well.  Patient had 1 episode of vomiting today.  Patient complained about pain to her left backside yesterday.  Otherwise no cough no difficulty breathing no chest pain.  ED Course: Rectal temperature 100.7, heart rate 53, and became tachycardic to 112, respirate rate 18-37, blood pressure systolic 188-416, improved with IV hydralazine given.  Respirate rate 18-32.  O2 sats greater than 92% on room air. WBC 10.3.  Lactic acidosis 4.1 > 3.6. UA not suggestive of infection. Head CT negative for acute abnormality. Chest x-ray showing tortuosity and aneurysmal dilation of thoracic aorta. Subsequent CTA chest abdomen and pelvis-without acute  abnormality.  No evidence of aortic dissection, shows 3.8 cm abdominal aortic aneurysm, right-sided sacroiliitis, calcified lesion in the greater curvature of stomach. 10 mg Geodon given.  Hydralazine 10 mg given.  2.5 L bolus given. IV Vanco and ceftriaxone started.  Hospitalist to admit for possible sepsis of unknown source.  Review of Systems: Exam limited due to dementia, and possible encephalopathy..  Past Medical History:  Diagnosis Date   Cerebral infarction ([place])    Glaucoma    Hyperlipemia    Hypertension    Osteoarthritis    TIA (transient ischemic attack)     Past Surgical History:  Procedure Laterality Date   EYE SURGERY N/A      reports that she has never smoked. She has never used smokeless tobacco. She reports that she does not drink alcohol and does not use drugs.  No Known Allergies  Family History  Problem Relation Age of Onset   Heart attack Mother    Stroke Father    Arthritis Sister    Hypertension Sister    Cancer Brother    Hypertension Brother     Prior to Admission medications   Medication Sig Start Date End Date Taking? Authorizing Provider  acetaminophen (TYLENOL) 500 MG tablet Take 1,000 mg by mouth 3 (three) times daily.    [provider]  alum & mag hydroxide-simeth (MAALOX/MYLANTA) 200-200-20 MG/5ML suspension Take 30 mLs by mouth every 6 (six) hours as needed for indigestion or heartburn.    [provider]  Amino Acids-Protein Hydrolys (FEEDING SUPPLEMENT, PRO-STAT 64,) LIQD Take 30  mLs by mouth daily.    [provider]  amLODipine (NORVASC) 10 MG tablet Take 10 mg by mouth daily.    [provider]  anastrozole (ARIMIDEX) 1 MG tablet Take 1 tablet (1 mg total) by mouth daily. 08/01/20   Derek Jack, MD  Ascorbic Acid 500 MG/15ML LIQD 1 tablet    [provider]  ASPERCREME LIDOCAINE 4 % PTCH Apply 1 patch topically daily. 04/03/21   [provider]  diclofenac Sodium  (VOLTAREN) 1 % GEL See admin instructions.    [provider]  docusate sodium (COLACE) 100 MG capsule Take 100 mg by mouth daily.    [provider]  famotidine (PEPCID) 20 MG tablet Take 20 mg by mouth 2 (two) times daily. 06/26/20   [provider]  gabapentin (NEURONTIN) 100 MG capsule Take 100 mg by mouth at bedtime. 11/30/20   [provider]  hydroxypropyl methylcellulose / hypromellose (ISOPTO TEARS / GONIOVISC) 2.5 % ophthalmic solution 1 drop 4 (four) times daily.    [provider]  latanoprost (XALATAN) 0.005 % ophthalmic solution SMARTSIG:In Eye(s) 07/10/20   [provider]  loteprednol (LOTEMAX) 0.5 % ophthalmic suspension SMARTSIG:In Eye(s) 08/10/20   [provider]  Magnesium 400 MG TABS Take 1 tablet by mouth at bedtime.    [provider]  magnesium hydroxide (MILK OF MAGNESIA) 400 MG/5ML suspension Take 30 mLs by mouth daily as needed for mild constipation.    [provider]  melatonin 3 MG TABS tablet Take 3 mg by mouth at bedtime.    [provider]  Multiple Vitamin (TAB-A-VITE) TABS Take 1 tablet by mouth daily. 02/22/21   [provider]  nitroGLYCERIN (NITROSTAT) 0.4 MG SL tablet Place 0.4 mg under the tongue every 5 (five) minutes as needed for chest pain.    [provider]  olmesartan-hydrochlorothiazide (BENICAR HCT) 40-12.5 MG tablet Take 1 tablet by mouth daily. 08/25/20   [provider]  potassium chloride SA (KLOR-CON) 20 MEQ tablet Take 20 mEq by mouth daily. 08/25/20   [provider]  senna (SENOKOT) 8.6 MG TABS tablet Take 2 tablets by mouth at bedtime.    [provider]  sodium chloride (MURO 128) 5 % ophthalmic solution SMARTSIG:In Eye(s) 05/15/21   [provider]  SYSTANE 0.4-0.3 % SOLN Apply to eye. 05/16/21   [provider]  traZODone (DESYREL) 50 MG tablet Take 50 mg by mouth at bedtime as needed for sleep.     [provider]    Physical Exam: Mental exam due to baseline dementia, altered mental status Vitals:   01/20/22 1443 01/20/22 1500 01/20/22 1507 01/20/22 1515  BP:  (!) 190/105  (!) 176/87  Pulse:   (!) 110 (!) 112  Resp:  (!) 25 (!) 23 20  Temp:      TempSrc:      SpO2:   92% 92%  Height: '5\' 5"'$  (1.651 m)       Constitutional: NAD, calm, comfortable Vitals:   01/20/22 1443 01/20/22 1500 01/20/22 1507 01/20/22 1515  BP:  (!) 190/105  (!) 176/87  Pulse:   (!) 110 (!) 112  Resp:  (!) 25 (!) 23 20  Temp:      TempSrc:      SpO2:   92% 92%  Height: '5\' 5"'$  (1.651 m)      Eyes: Corneal scarring, worse on the right, lids and conjunctivae normal ENMT: Mucous membranes are moist. Poor dentition.  Dry  mucous membranes Neck: normal, supple, no masses, no thyromegaly Respiratory: clear to auscultation bilaterally, no wheezing, no crackles. Normal respiratory effort. No accessory muscle use.  Cardiovascular: Tachycardic, regular rate and rhythm, no murmurs / rubs / gallops. No extremity edema.  Extremities warm. Abdomen: no tenderness, no masses palpated. No hepatosplenomegaly. Bowel sounds positive.  Musculoskeletal: no clubbing / cyanosis. No joint deformity upper and lower extremities. Good ROM, no contractures. Normal muscle tone.  Skin: no rashes, lesions, ulcers. No induration Neurologic: No apparent cranial nerve abnormality moving extremities spontaneously, limited exam unable to grip strength Psychiatric: Awake, answering yes to all questions, slightly lethargic.  Labs on Admission: I have personally reviewed following labs and imaging studies  CBC: Recent Labs  Lab 01/20/22 1230  WBC 10.3  NEUTROABS 7.8*  HGB 14.1  HCT 45.0  MCV 95.7  PLT 443   Basic Metabolic Panel: Recent Labs  Lab 01/20/22 1230  NA 138  K 3.7  CL 97*  CO2 27  GLUCOSE 152*  BUN 17  CREATININE 1.24*  CALCIUM 9.5   GFR: CrCl cannot be calculated (Unknown ideal weight.). Liver  Function Tests: Recent Labs  Lab 01/20/22 1230  AST 20  ALT 10  ALKPHOS 61  BILITOT 0.7  PROT 8.1  ALBUMIN 4.2   Coagulation Profile: Recent Labs  Lab 01/20/22 1230  INR 1.0   Urine analysis:    Component Value Date/Time   COLORURINE YELLOW 01/20/2022 [place] 01/20/2022 1157   LABSPEC 1.010 01/20/2022 1157   PHURINE 6.0 01/20/2022 1157   GLUCOSEU NEGATIVE 01/20/2022 1157   HGBUR SMALL (A) 01/20/2022 1157   BILIRUBINUR NEGATIVE 01/20/2022 1157   KETONESUR 5 (A) 01/20/2022 1157   PROTEINUR >=300 (A) 01/20/2022 1157   NITRITE NEGATIVE 01/20/2022 1157   LEUKOCYTESUR NEGATIVE 01/20/2022 1157    Radiological Exams on Admission: CT Angio Chest/Abd/Pel for Dissection W and/or Wo Contrast  Result Date: 01/20/2022 CLINICAL DATA:  Evaluate for aortic dissection. EXAM: CT ANGIOGRAPHY CHEST, ABDOMEN AND PELVIS TECHNIQUE: Non-contrast CT of the chest was initially obtained. Multidetector CT imaging through the chest, abdomen and pelvis was performed using the standard protocol during bolus administration of intravenous contrast. Multiplanar reconstructed images and MIPs were obtained and reviewed to evaluate the vascular anatomy. RADIATION DOSE REDUCTION: This exam was performed according to the departmental dose-optimization program which includes automated exposure control, adjustment of the mA and/or kV according to patient size and/or use of iterative reconstruction technique. CONTRAST:  80m OMNIPAQUE IOHEXOL 350 MG/ML SOLN COMPARISON:  None Available. FINDINGS: CTA CHEST FINDINGS Cardiovascular: Preferential opacification of the thoracic aorta. No evidence of thoracic aortic aneurysm or dissection. Normal heart size. No pericardial effusion. Aortic atherosclerosis and 3 vessel coronary artery calcifications. Mediastinum/Nodes: No enlarged mediastinal, hilar, or axillary lymph nodes. Thyroid gland, trachea, and esophagus demonstrate no significant findings. Lungs/Pleura:  No pleural effusion or airspace consolidation. There is subsegmental atelectasis identified involving the anterior right middle lobe, image 79/12. Scar versus subsegmental atelectasis within the anterior right lower lobe noted. Musculoskeletal: No acute or suspicious osseous findings. Spondylosis identified in the thoracic spine. Review of the MIP images confirms the above findings. CTA ABDOMEN AND PELVIS FINDINGS VASCULAR Aorta: Infra renal abdominal aortic aneurysm is identified measuring 3.8 cm in maximum AP diameter. There is eccentric mural thrombus within the aneurysmal dilated aorta measuring approximately 1.8 cm in thickness. After scratch set calcified at scratch set atherosclerotic calcifications noted throughout the aorta. No signs of dissection or significant stenosis. Celiac: Calcified plaque  at the origin of the celiac artery results in approximately 50% stenosis. SMA: Patent without evidence of aneurysm, dissection, vasculitis or significant stenosis. Renals: Calcified plaque at the origin of the right renal artery results in approximately 40% stenosis. Left renal artery appears patent without significant stenosis. IMA: Patent without evidence of aneurysm, dissection, vasculitis or significant stenosis. Inflow: Patent without evidence of aneurysm, dissection, vasculitis or significant stenosis. Veins: No obvious venous abnormality within the limitations of this arterial phase study. Review of the MIP images confirms the above findings. NON-VASCULAR Hepatobiliary: No focal liver abnormality is seen. No gallstones, gallbladder wall thickening, or biliary dilatation. Pancreas: Unremarkable. No pancreatic ductal dilatation or surrounding inflammatory changes. Spleen: Normal in size without focal abnormality. Adrenals/Urinary Tract: Normal adrenal glands. Bosniak class 1 cyst arises off the upper pole of the right kidney measuring 4.2 cm. Bosniak class 1 cyst arising off the inferior pole of the left  kidney measures 2.4 cm. Additional too small to reliably characterize low-attenuation kidney lesions are identified bilaterally compatible with Bosniak class 2 cyst. No follow-up imaging of these kidney lesions recommended. No kidney stones identified bilaterally. No suspicious mass or hydronephrosis. Urinary bladder is unremarkable. Stomach/Bowel: Indeterminate partially calcified exophytic lesion is noted along the greater curvature of the stomach with possible noncalcified in the luminal component. This measures 1.7 x 1.3 cm and is of uncertain clinical significance. The appendix is visualized and appears normal. No bowel wall thickening, inflammation, or distension. Lymphatic: No abdominopelvic adenopathy. Reproductive: Uterus and bilateral adnexa are unremarkable. Other: No free fluid or fluid collections. Musculoskeletal: Right-sided sacroiliitis is identified. Multilevel degenerative disc disease is identified throughout the lumbar spine. No acute or suspicious osseous findings. Bilateral hip osteoarthritis, right greater than left. Review of the MIP images confirms the above findings. IMPRESSION: 1. No evidence for aortic dissection. 2. 3.8 cm infrarenal abdominal aortic aneurysm. Recommend follow-up every 2 years. Reference: J Am Coll Radiol 4132;44:010-272. 3. Right-sided sacroiliitis. 4. Small, indeterminate partially calcified exophytic lesion is noted along the greater curvature of the stomach with possible noncalcified in the luminal component. This is of uncertain clinical significance, but is favored to represent a chronic abnormality. Consider follow-up with gastroenterology. 5. Aortic Atherosclerosis (ICD10-I70.0). Electronically Signed   By: Kerby Moors M.D.   On: 01/20/2022 15:06   CT Head Wo Contrast  Result Date: 01/20/2022 CLINICAL DATA:  [place]irium EXAM: CT HEAD WITHOUT CONTRAST TECHNIQUE: Contiguous axial images were obtained from the base of the skull through the vertex without  intravenous contrast. RADIATION DOSE REDUCTION: This exam was performed according to the departmental dose-optimization program which includes automated exposure control, adjustment of the mA and/or kV according to patient size and/or use of iterative reconstruction technique. COMPARISON:  None Available. FINDINGS: Brain: Generalized atrophy. Moderate white matter hypodensity in the periventricular deep white matter bilaterally. Negative for acute infarct, hemorrhage, mass. Empty sella with enlargement of the sella filled with CSF. Vascular: Negative for hyperdense vessel Skull: Negative Sinuses/Orbits: Chronic fracture right maxillary sinus. Mucosal edema with calcification right maxillary sinus. Remaining sinuses clear. Bilateral cataract extraction Other: None IMPRESSION: Atrophy and chronic white matter changes.  No acute abnormality. Electronically Signed   By: Franchot Gallo M.D.   On: 01/20/2022 14:47   DG Chest Portable 1 View  Result Date: 01/20/2022 CLINICAL DATA:  Possible sepsis EXAM: PORTABLE CHEST 1 VIEW COMPARISON:  None Available. FINDINGS: Transverse diameter of heart is increased. There is aneurysmal dilation of thoracic aorta. There are no signs of pulmonary edema or focal  pulmonary consolidation. Small linear densities are seen in lower lung fields. There is no significant pleural effusion or pneumothorax. Degenerative changes are noted in right shoulder. IMPRESSION: There is tortuosity and aneurysmal dilation of thoracic aorta. There are no signs of pulmonary edema or focal pulmonary consolidation. Small linear densities in the lower lung fields suggest scarring or subsegmental atelectasis. Electronically Signed   By: Elmer Picker M.D.   On: 01/20/2022 13:13    EKG: Independently reviewed.  Artifacts present, but no significant ST or T wave abnormalities.  Tachycardic, sinus.  Rate 105.  QTc 433.  Assessment/Plan Principal Problem:   Acute metabolic encephalopathy Active  Problems:   SIRS (systemic inflammatory response syndrome) (HCC)   Essential hypertension   Blindness of right eye   Assessment and Plan: * Acute metabolic encephalopathy Likely due to SIRs, dehydration.  Head CT negative for acute abnormality.  Per nursing home, baseline dementia, but daughter tells me she is unaware of this.  Wheelchair-bound.  Able to move all extremities.  Blind. -Fluids, antibiotics -Swallow evaluation  SIRS (systemic inflammatory response syndrome) (HCC) SIRS criteria, with fever 100.7, tachycardic heart rate 53 up to 112, respirate rate 18-32.  Significant lactic acidosis of 4.1 > 3.6.  At this time no focus of infection identified, UA not suggestive of infection, CTA chest pelvis and abdomen-no acute abnormality.  COVID test negative. -2.5 L bolus given, continue N/s 100cc/hr x 15hrs -Continue broad-spectrum antibiotics IV vancomycin cefepime and metronidazole -Follow-up blood and urine cultures -Obtain and trend procalcitonin  Essential hypertension Elevated to 157 systolic. Hydralazine 10 mg x 1 given with improvement in blood pressure. -As needed labetalol 10 mg for systolic greater than 262 -Resume home medications Norvasc, holding losartan/HCTZ with contrast exposure   DVT prophylaxis: Lovenox Code Status: DNR-confirmed with patient's daughter Adriana Stevens on the phone. Family Communication: None at bedside-talked to patient's daughter Adriana Stevens- on the phone, she is power of attorney.  Contact  H- [phone], [place] Disposition Plan: > 2 days Consults called: None Admission status: Inpt tele I certify that at the point of admission it is my clinical judgment that the patient will require inpatient hospital care spanning beyond 2 midnights from the point of admission due to high intensity of service, high risk for further deterioration and high frequency of surveillance required.    Author: Bethena Roys, MD 01/20/2022 5:09 PM  For  on call review [email or web address].

## 2022-01-20 NOTE — Assessment & Plan Note (Addendum)
-   Etc. hypertension, noncompliant with meds -Currently titrating medication, added clonidine patch 0.3 mg,  - Hydralazine 10 mg x 1 given with improvement in blood pressure. -As needed labetalol 10 mg for systolic greater than 180 -Resume home medications Norvasc --- Per nursing she has been refusing her oral medications  - Holding losartan/HCTZ with contrast exposure -Since patient is noncompliant not taking oral medications, scheduled supplement IV  hydralazine for now

## 2022-01-20 NOTE — Assessment & Plan Note (Addendum)
Likely due to SIRs, dehydration.  Head CT negative for acute abnormality.  Per nursing home, baseline dementia, but daughter tells me she is unaware of this.  Wheelchair-bound.  Able to move all extremities.  Blind. -Fluids, antibiotics -Swallow evaluation

## 2022-01-20 NOTE — ED Provider Notes (Signed)
At the time of change of shift Pt found to be febrile to 100.5, has lactic > 4,  2L of IVF given with improvement to 3.6 WBC OK, BMP OK, UA OK CT dissection without source of infection or PE Stays altered and tachycardic Abx given, 30cc / Kg total given with additional 500cc of saline Tylenol PR will be ordered Vanc added Willa dmit to hospitalist for sepsis unknown source.  .Critical Care  Performed by: Noemi Chapel, MD Authorized by: Noemi Chapel, MD   Critical care provider statement:    Critical care time (minutes):  30   Critical care time was exclusive of:  Separately billable procedures and treating other patients and teaching time   Critical care was necessary to treat or prevent imminent or life-threatening deterioration of the following conditions:  Sepsis   Critical care was time spent personally by me on the following activities:  Development of treatment plan with patient or surrogate, discussions with consultants, evaluation of patient's response to treatment, examination of patient, ordering and review of laboratory studies, ordering and review of radiographic studies, ordering and performing treatments and interventions, pulse oximetry, re-evaluation of patient's condition, review of old charts and obtaining history from patient or surrogate   I assumed direction of critical care for this patient from another provider in my specialty: yes     Care discussed with: admitting provider   Comments:        Final diagnoses:  None      Noemi Chapel, MD 01/21/22 1104

## 2022-01-20 NOTE — ED Triage Notes (Signed)
BIB EMS from [facility], reports slightly confused, but has had a strong urine smell x5 days

## 2022-01-20 NOTE — Assessment & Plan Note (Deleted)
SIRS criteria, with fever 100.7, tachycardic heart rate 53 up to 112, respirate rate 18-32.  Significant lactic acidosis of 4.1 > 3.6.  At this time no focus of infection identified, UA not suggestive of infection, CTA chest pelvis and abdomen-no acute abnormality.  COVID test negative. -2.5 L bolus given, continue N/s 100cc/hr x 15hrs -Continue broad-spectrum antibiotics IV vancomycin cefepime and metronidazole -Follow-up blood and urine cultures -Obtain and trend procalcitonin

## 2022-01-21 ENCOUNTER — Inpatient Hospital Stay (HOSPITAL_COMMUNITY): Payer: Medicare Other

## 2022-01-21 ENCOUNTER — Encounter (HOSPITAL_COMMUNITY): Payer: Self-pay | Admitting: Internal Medicine

## 2022-01-21 DIAGNOSIS — G9341 Metabolic encephalopathy: Secondary | ICD-10-CM | POA: Diagnosis not present

## 2022-01-21 DIAGNOSIS — K089 Disorder of teeth and supporting structures, unspecified: Secondary | ICD-10-CM

## 2022-01-21 DIAGNOSIS — R6520 Severe sepsis without septic shock: Secondary | ICD-10-CM | POA: Diagnosis not present

## 2022-01-21 DIAGNOSIS — A419 Sepsis, unspecified organism: Secondary | ICD-10-CM

## 2022-01-21 DIAGNOSIS — I10 Essential (primary) hypertension: Secondary | ICD-10-CM | POA: Diagnosis not present

## 2022-01-21 DIAGNOSIS — H5440 Blindness, one eye, unspecified eye: Secondary | ICD-10-CM | POA: Diagnosis not present

## 2022-01-21 DIAGNOSIS — Z7189 Other specified counseling: Secondary | ICD-10-CM | POA: Diagnosis not present

## 2022-01-21 DIAGNOSIS — Z515 Encounter for palliative care: Secondary | ICD-10-CM

## 2022-01-21 DIAGNOSIS — G9340 Encephalopathy, unspecified: Secondary | ICD-10-CM

## 2022-01-21 DIAGNOSIS — E876 Hypokalemia: Secondary | ICD-10-CM | POA: Diagnosis not present

## 2022-01-21 LAB — CBC
HCT: 38.9 % (ref 36.0–46.0)
Hemoglobin: 12.6 g/dL (ref 12.0–15.0)
MCH: 29.8 pg (ref 26.0–34.0)
MCHC: 32.4 g/dL (ref 30.0–36.0)
MCV: 92 fL (ref 80.0–100.0)
Platelets: 183 10*3/uL (ref 150–400)
RBC: 4.23 MIL/uL (ref 3.87–5.11)
RDW: 12.9 % (ref 11.5–15.5)
WBC: 10 10*3/uL (ref 4.0–10.5)
nRBC: 0 % (ref 0.0–0.2)

## 2022-01-21 LAB — URINE CULTURE: Culture: NO GROWTH

## 2022-01-21 LAB — PROCALCITONIN: Procalcitonin: <0.1 ng/mL

## 2022-01-21 LAB — BASIC METABOLIC PANEL
Anion gap: 10 (ref 5–15)
BUN: 12 mg/dL (ref 8–23)
CO2: 28 mmol/L (ref 22–32)
Calcium: 8.4 mg/dL — ABNORMAL LOW (ref 8.9–10.3)
Chloride: 97 mmol/L — ABNORMAL LOW (ref 98–111)
Creatinine, Ser: 0.93 mg/dL (ref 0.44–1.00)
GFR, Estimated: 57 mL/min — ABNORMAL LOW (ref >60)
Glucose, Bld: 106 mg/dL — ABNORMAL HIGH (ref 70–99)
Potassium: 3.1 mmol/L — ABNORMAL LOW (ref 3.5–5.1)
Sodium: 135 mmol/L (ref 135–145)

## 2022-01-21 MED ORDER — HYDRALAZINE HCL 20 MG/ML IJ SOLN
10.0000 mg | Freq: Three times a day (TID) | INTRAMUSCULAR | Status: DC
  Administered 2022-01-21 – 2022-01-22 (×3): 10 mg via INTRAVENOUS
  Filled 2022-01-21 (×3): qty 1

## 2022-01-21 MED ORDER — ENOXAPARIN SODIUM 40 MG/0.4ML IJ SOSY
40.0000 mg | PREFILLED_SYRINGE | INTRAMUSCULAR | Status: DC
  Administered 2022-01-21 – 2022-01-23 (×3): 40 mg via SUBCUTANEOUS
  Filled 2022-01-21 (×3): qty 0.4

## 2022-01-21 MED ORDER — IOHEXOL 300 MG/ML  SOLN
75.0000 mL | Freq: Once | INTRAMUSCULAR | Status: AC | PRN
  Administered 2022-01-21: 75 mL via INTRAVENOUS

## 2022-01-21 MED ORDER — SODIUM CHLORIDE 0.9 % IV SOLN
2.0000 g | Freq: Two times a day (BID) | INTRAVENOUS | Status: DC
  Administered 2022-01-21 – 2022-01-23 (×5): 2 g via INTRAVENOUS
  Filled 2022-01-21 (×5): qty 12.5

## 2022-01-21 MED ORDER — SODIUM CHLORIDE 0.9 % IV SOLN: INTRAVENOUS | Status: AC

## 2022-01-21 MED ORDER — HALOPERIDOL 2 MG PO TABS
2.0000 mg | ORAL_TABLET | Freq: Four times a day (QID) | ORAL | Status: DC | PRN
  Administered 2022-01-23: 2 mg via ORAL
  Filled 2022-01-21: qty 1

## 2022-01-21 MED ORDER — VANCOMYCIN HCL 750 MG/150ML IV SOLN
750.0000 mg | INTRAVENOUS | Status: DC
  Administered 2022-01-21 – 2022-01-22 (×2): 750 mg via INTRAVENOUS
  Filled 2022-01-21 (×2): qty 150

## 2022-01-21 MED ORDER — HALOPERIDOL LACTATE 5 MG/ML IJ SOLN: 2.0000 mg | Freq: Four times a day (QID) | INTRAMUSCULAR | Status: DC | PRN

## 2022-01-21 MED ORDER — HALOPERIDOL LACTATE 5 MG/ML IJ SOLN
2.0000 mg | Freq: Four times a day (QID) | INTRAMUSCULAR | Status: DC | PRN
  Administered 2022-01-21: 2 mg via INTRAMUSCULAR
  Filled 2022-01-21: qty 1

## 2022-01-21 NOTE — Progress Notes (Signed)
Pharmacy Antibiotic Note  Adriana Stevens is a 88 y.o. female admitted on 01/20/2022 with  unknown source .  Pharmacy has been consulted for Vancomycin and cefepime dosing. Improvement in renal fxn, adjust abx  Plan: Increase Cefepime 2gm IV q12h Change Vancomycin '750mg'$  mg IV Q 24 hrs. Goal AUC 400-550. Expected AUC: 452 SCr used: 0.93  F/U cxs and clinical progress Monitor V/S, labs, and levels as indicated  Height: '5\' 5"'$  (165.1 cm) Weight: 74.8 kg (165 lb) IBW/kg (Calculated) : 57  Temp (24hrs), Avg:99 F (37.2 C), Min:97.6 F (36.4 C), Max:100.7 F (38.2 C)  Recent Labs  Lab 01/20/22 1230 01/20/22 1240 01/20/22 1450 01/20/22 1805 01/21/22 0550  WBC 10.3  --   --   --  10.0  CREATININE 1.24*  --   --   --  0.93  LATICACIDVEN  --  4.1* 3.6* 1.7  --      Estimated Creatinine Clearance: 37.4 mL/min (by C-G formula based on SCr of 0.93 mg/dL).    No Known Allergies  Antimicrobials this admission: Vancomycin 9/17 >>  Cefepime 9/17 >>   Microbiology results: 9/17 PVX:YIAX 9/17 UCx: pending   MRSA PCR:   Thank you for allowing pharmacy to be a part of this patient's care.  Isac Sarna, BS Pharm D, BCPS Clinical Pharmacist 01/21/2022 8:21 AM

## 2022-01-21 NOTE — Assessment & Plan Note (Signed)
-   Stable, chronic no changes per daughter

## 2022-01-21 NOTE — Assessment & Plan Note (Addendum)
-  Improved sepsis physiology  Blood pressure (!) 182/90, pulse 73, temperature 98.7 F (37.1 C), RR18, weight 74.8 kg, SpO2 98 %.   Met sepsis criteria on admissio change with fever 100.7, tachycardic heart rate 53 - 112, RR 18-32. With toxic metabolic encephalopathy  Significant lactic acidosis of 4.1 > 3.6.   - At this time no focus of infection identified, UA not suggestive of infection,  CTA chest pelvis and abdomen-no acute abnormality.  COVID test negative. -Possible source of infection poor dentition -CT maxillary facial>> ordered  -2.5 L bolus given, continue N/s 100cc/hr  -Continue broad-spectrum antibiotics IV vancomycin cefepime and metronidazole -Follow-up blood and urine cultures -If blood cultures remain negative for 24 hours then will de-escalate IV antibiotics

## 2022-01-21 NOTE — Assessment & Plan Note (Signed)
-   Monitoring and repleting

## 2022-01-21 NOTE — Progress Notes (Addendum)
PROGRESS NOTE    Patient: Adriana Stevens                            PCP: Vidal Schwalbe, MD                    DOB: February 16, 1926            DOA: 01/20/2022 HWE:[id number]             DOS: 01/21/2022, 11:35 AM   LOS: 1 day   Date of Service: The patient was seen and examined on 01/21/2022  Subjective:   The patient was seen and examined this morning. Etc. hypertension noted otherwise hemodynamically stable Patient is now more awake, alert cooperative, oriented x1, poor cognition   Daughter -POA present at bedside  Brief Narrative:   Adriana Stevens is a 88 y.o. female with medical history significant for HTN, Dementia, blind. [facility] status, confusion, and reports that patient's urine had a strong smell, symptoms have been ongoing over the past 5 days.  Reportedly patient was somnolent to obtunded. Also with vomiting and poor oral intake.   Per ED provider, patient is blind, and does not talk.  At the time of evaluation, patient was awake, she says 1 word answers yes to all my questions, but unable to give further details to answers.   Per patient's daughter Adriana Stevens on the phone, she is not aware of the diagnosis of dementia, at baseline patient has mild memory problems, but she can talk and answer questions.  Patient is wheelchair-bound.  She reports she called her mother yesterday on the phone and her mother appeared confused, repeating questions and answers.  She believes over the past 5 days patient has not been doing well.  Patient had 1 episode of vomiting today.  Patient complained about pain to her left backside yesterday.  Otherwise no cough no difficulty breathing no chest pain.   ED Course: Rectal temperature 100.7, HR 53, tachycardic to 112, respirate rate 18-37, blood pressure systolic 893-810, improved with IV hydralazine given.  RR 18-32.  O2 sats greater than 92% on room air. WBC 10.3.  Lactic acidosis  4.1 > 3.6. UA Not suggestive of infection. Head CT -  negative for acute abnormality. Chest x-ray showing tortuosity and aneurysmal dilation of thoracic aorta. Subsequent CTA chest abdomen and pelvis-without acute abnormality.  No evidence of aortic dissection, shows 3.8 cm abdominal aortic aneurysm, right-sided sacroiliitis, calcified lesion in the greater curvature of stomach. 10 mg Geodon given.  Hydralazine 10 mg given.  2.5 L bolus given. IV Vanco and ceftriaxone started.    Hospitalist to admit for possible sepsis of unknown source.      Assessment & Plan:   Principal Problem:   Sepsis ([facility]) Active Problems:   Poor dentition   Hypokalemia   SIRS (systemic inflammatory response syndrome) (HCC)   Essential hypertension   Blindness of right eye   Acute metabolic encephalopathy     Assessment and Plan: * Sepsis ([place]) - Improved sepsis physiology  Blood pressure (!) 182/90, pulse 73, temperature 98.7 F (37.1 C), RR18, weight 74.8 kg, SpO2 98 %.   Met sepsis criteria on admissio change with fever 100.7, tachycardic heart rate 53 - 112, RR 18-32. With toxic metabolic encephalopathy  Significant lactic acidosis of 4.1 > 3.6.   - At this time no focus  of infection identified, UA not suggestive of infection,  CTA chest pelvis and abdomen-no acute abnormality.  COVID test negative. -Possible source of infection poor dentition -CT maxillary facial>> ordered  -2.5 L bolus given, continue N/s 100cc/hr  -Continue broad-spectrum antibiotics IV vancomycin cefepime and metronidazole -Follow-up blood and urine cultures -If blood cultures remain negative for 24 hours then will de-escalate IV antibiotics   Hypokalemia - Monitoring and repleting  Poor dentition - Very poor dentition visible, patient does not cooperate does not open his mouth for full examination -No tenderness visible swelling or drainage noted -Front to 2 to incisor darkish black in color, along with the  gum lines -Obtaining CT maxillofacial to rule out any abscess -Possible source of infection, continue current antibiotics  Acute metabolic encephalopathy Likely due to SIRs, dehydration.  Head CT negative for acute abnormality.  Per nursing home, baseline dementia, but daughter tells me she is unaware of this.  Wheelchair-bound.  Able to move all extremities.  Blind. -Fluids, antibiotics -Swallow evaluation  Blindness of right eye - Stable, chronic no changes per daughter  Essential hypertension  -Accelerated hypertension, patient is noncompliant with medication -On arrival BP elevated to 235 systolic. Hydralazine 10 mg x 1 given with improvement in blood pressure. -As needed labetalol 10 mg for systolic greater than 361 -Resume home medications Norvasc,  - Holding losartan/HCTZ with contrast exposure -Since patient is noncompliant not taking oral medications, scheduled hydralazine for now    ----------------------------------------------------------------------------------------------------------------------------------------------- Nutritional status:  The patient's BMI is: Body mass index is 27.46 kg/m. I agree with the assessment and plan as outlined ---------------------------------------------------------------------------------------------------------------------------------------------------- Cultures; Blood Cultures x 2 >> NGT Urine Culture  >>> NGT   ------------------------------------------------------------------------------------------------------------------------------------------------  DVT prophylaxis:  enoxaparin (LOVENOX) injection 40 mg Start: 01/21/22 2015   Code Status:   Code Status: DNR  Family Communication: POA daughter present at bedside updated Palliative care consulted to determine goals of care The above findings and plan of care has been discussed with patient (and family)  in detail,  they expressed understanding and agreement of  above. -Advance care planning has been discussed.   Admission status:   Status is: Inpatient Remains inpatient appropriate because: Treating sepsis physiology, with IV fluids, broad-spectrum antibiotics     Procedures:   No admission procedures for hospital encounter.   Antimicrobials:  Anti-infectives (From admission, onward)    Start     Dose/Rate Route Frequency Ordered Stop   01/22/22 1915  vancomycin (VANCOREADY) IVPB 1250 mg/250 mL  Status:  Discontinued       See Hyperspace for full Linked Orders Report.   1,250 mg 166.7 mL/hr over 90 Minutes Intravenous Every 48 hours 01/20/22 1745 01/21/22 0826   01/21/22 1600  vancomycin (VANCOREADY) IVPB 750 mg/150 mL       See Hyperspace for full Linked Orders Report.   750 mg 150 mL/hr over 60 Minutes Intravenous Every 24 hours 01/21/22 0826     01/21/22 0830  ceFEPIme (MAXIPIME) 2 g in sodium chloride 0.9 % 100 mL IVPB        2 g 200 mL/hr over 30 Minutes Intravenous Every 12 hours 01/21/22 0826     01/20/22 2015  metroNIDAZOLE (FLAGYL) IVPB 500 mg        500 mg 100 mL/hr over 60 Minutes Intravenous Every 12 hours 01/20/22 1929     01/20/22 1900  vancomycin (VANCOREADY) IVPB 500 mg/100 mL       See Hyperspace for full Linked Orders Report.   500 mg  100 mL/hr over 60 Minutes Intravenous  Once 01/20/22 1745 01/20/22 2329   01/20/22 1800  ceFEPIme (MAXIPIME) 2 g in sodium chloride 0.9 % 100 mL IVPB  Status:  Discontinued        2 g 200 mL/hr over 30 Minutes Intravenous Every 24 hours 01/20/22 1745 01/21/22 0826   01/20/22 1530  vancomycin (VANCOCIN) IVPB 1000 mg/200 mL premix        1,000 mg 200 mL/hr over 60 Minutes Intravenous  Once 01/20/22 1522 01/20/22 1732   01/20/22 1315  cefTRIAXone (ROCEPHIN) 1 g in sodium chloride 0.9 % 100 mL IVPB        1 g 200 mL/hr over 30 Minutes Intravenous  Once 01/20/22 1312 01/20/22 1430        Medication:   amLODipine  10 mg Oral Daily   enoxaparin (LOVENOX) injection  40 mg  Subcutaneous Q24H   hydrALAZINE  10 mg Intravenous Q8H    acetaminophen **OR** acetaminophen, haloperidol lactate, labetalol, ondansetron **OR** ondansetron (ZOFRAN) IV, polyethylene glycol   Objective:   Vitals:   01/20/22 1732 01/20/22 1819 01/21/22 0029 01/21/22 0346  BP: (!) 185/97 (!) 185/91 (!) 186/104 (!) 182/90  Pulse: 88 85 81 73  Resp: (!) 23 13 16 18   Temp:   99.1 F (37.3 C) 98.7 F (37.1 C)  TempSrc:      SpO2: 98% 100% 100% 98%  Weight:      Height:        Intake/Output Summary (Last 24 hours) at 01/21/2022 1135 Last data filed at 01/21/2022 4235 Gross per 24 hour  Intake 3248.52 ml  Output 600 ml  Net 2648.52 ml   Filed Weights   01/20/22 1715  Weight: 74.8 kg     Examination:   Physical Exam  Constitution: Awake alert cooperative, but poor comprehension, Psychiatric:   Normal and stable mood and affect, cognition intact,   HEENT:       Very poor dentition visible  normocephalic, PERRL, otherwise with in Normal limits  Chest:         Chest symmetric Cardio vascular:  S1/S2, RRR, No murmure, No Rubs or Gallops  pulmonary: Clear to auscultation bilaterally, respirations unlabored, negative wheezes / crackles Abdomen: Soft, non-tender, non-distended, bowel sounds,no masses, no organomegaly Muscular skeletal: Limited exam - in bed, able to move all 4 extremities,   Neuro: CNII-XII intact. , normal motor and sensation, reflexes intact  Extremities: No pitting edema lower extremities, +2 pulses  Skin: Dry, warm to touch, negative for any Rashes, No open wounds Wounds: per nursing documentation   ------------------------------------------------------------------------------------------------------------------------------------------    LABs:     Latest Ref Rng & Units 01/21/2022    5:50 AM 01/20/2022   12:30 PM 07/24/2021    3:11 PM  CBC  WBC 4.0 - 10.5 K/uL 10.0  10.3  8.1   Hemoglobin 12.0 - 15.0 g/dL 12.6  14.1  13.4   Hematocrit 36.0 - 46.0 %  38.9  45.0  42.8   Platelets 150 - 400 K/uL 183  192  145       Latest Ref Rng & Units 01/21/2022    5:50 AM 01/20/2022   12:30 PM 07/24/2021    3:11 PM  CMP  Glucose 70 - 99 mg/dL 106  152  113   BUN 8 - 23 mg/dL 12  17  18    Creatinine 0.44 - 1.00 mg/dL 0.93  1.24  1.15   Sodium 135 - 145 mmol/L 135  138  138   Potassium 3.5 - 5.1 mmol/L 3.1  3.7  3.8   Chloride 98 - 111 mmol/L 97  97  99   CO2 22 - 32 mmol/L 28  27  26    Calcium 8.9 - 10.3 mg/dL 8.4  9.5  9.1   Total Protein 6.5 - 8.1 g/dL  8.1  7.6   Total Bilirubin 0.3 - 1.2 mg/dL  0.7  0.5   Alkaline Phos 38 - 126 U/L  61  62   AST 15 - 41 U/L  20  21   ALT 0 - 44 U/L  10  11        Micro Results Recent Results (from the past 240 hour(s))  Blood Culture (routine x 2)     Status: None (Preliminary result)   Collection Time: 01/20/22 12:23 PM   Specimen: BLOOD  Result Value Ref Range Status   Specimen Description BLOOD LEFT ANTECUBITAL  Final   Special Requests   Final    BOTTLES DRAWN AEROBIC ONLY Blood Culture adequate volume   Culture   Final    NO GROWTH < 12 HOURS Performed at [facility], [address], [place], [place] [address]    Report Status PENDING  Incomplete  Blood Culture (routine x 2)     Status: None (Preliminary result)   Collection Time: 01/20/22 12:40 PM   Specimen: BLOOD  Result Value Ref Range Status   Specimen Description BLOOD BLOOD LEFT HAND  Final   Special Requests   Final    BOTTLES DRAWN AEROBIC ONLY Blood Culture adequate volume   Culture   Final    NO GROWTH < 12 HOURS Performed at [facility], [address], [place], [place] [address]    Report Status PENDING  Incomplete  Resp Panel by RT-PCR (Flu A&B, Covid) Anterior Nasal Swab     Status: None   Collection Time: 01/20/22 12:45 PM   Specimen: Anterior Nasal Swab  Result Value Ref Range Status   SARS Coronavirus 2 by RT PCR NEGATIVE NEGATIVE Final    Comment: (NOTE) SARS-CoV-2 target nucleic acids are NOT DETECTED.  The  SARS-CoV-2 RNA is generally detectable in upper respiratory specimens during the acute phase of infection. The lowest concentration of SARS-CoV-2 viral copies this assay can detect is 138 copies/mL. A negative result does not preclude SARS-Cov-2 infection and should not be used as the sole basis for treatment or other patient management decisions. A negative result may occur with  improper specimen collection/handling, submission of specimen other than nasopharyngeal swab, presence of viral mutation(s) within the areas targeted by this assay, and inadequate number of viral copies(<138 copies/mL). A negative result must be combined with clinical observations, patient history, and epidemiological information. The expected result is Negative.  Fact Sheet for Patients:  EntrepreneurPulse.com.au  Fact Sheet for Healthcare Providers:  IncredibleEmployment.be  This test is no t yet approved or cleared by the [place] FDA and  has been authorized for detection and/or diagnosis of SARS-CoV-2 by FDA under an Emergency Use Authorization (EUA). This EUA will remain  in effect (meaning this test can be used) for the duration of the COVID-19 declaration under Section 564(b)(1) of the Act, 21 U.S.C.section 360bbb-3(b)(1), unless the authorization is terminated  or revoked sooner.       Influenza A by PCR NEGATIVE NEGATIVE Final   Influenza B by PCR NEGATIVE NEGATIVE Final    Comment: (NOTE) The Xpert Xpress SARS-CoV-2/FLU/RSV plus assay is intended as an aid  in the diagnosis of influenza from Nasopharyngeal swab specimens and should not be used as a sole basis for treatment. Nasal washings and aspirates are unacceptable for Xpert Xpress SARS-CoV-2/FLU/RSV testing.  Fact Sheet for Patients: EntrepreneurPulse.com.au  Fact Sheet for Healthcare Providers: IncredibleEmployment.be  This test is not yet approved or  cleared by the [place] FDA and has been authorized for detection and/or diagnosis of SARS-CoV-2 by FDA under an Emergency Use Authorization (EUA). This EUA will remain in effect (meaning this test can be used) for the duration of the COVID-19 declaration under Section 564(b)(1) of the Act, 21 U.S.C. section 360bbb-3(b)(1), unless the authorization is terminated or revoked.  Performed at [facility], [address], [place], [place] [address]     Radiology Reports CT Angio Chest/Abd/Pel for Dissection W and/or Wo Contrast  Result Date: 01/20/2022 CLINICAL DATA:  Evaluate for aortic dissection. EXAM: CT ANGIOGRAPHY CHEST, ABDOMEN AND PELVIS TECHNIQUE: Non-contrast CT of the chest was initially obtained. Multidetector CT imaging through the chest, abdomen and pelvis was performed using the standard protocol during bolus administration of intravenous contrast. Multiplanar reconstructed images and MIPs were obtained and reviewed to evaluate the vascular anatomy. RADIATION DOSE REDUCTION: This exam was performed according to the departmental dose-optimization program which includes automated exposure control, adjustment of the mA and/or kV according to patient size and/or use of iterative reconstruction technique. CONTRAST:  88m OMNIPAQUE IOHEXOL 350 MG/ML SOLN COMPARISON:  None Available. FINDINGS: CTA CHEST FINDINGS Cardiovascular: Preferential opacification of the thoracic aorta. No evidence of thoracic aortic aneurysm or dissection. Normal heart size. No pericardial effusion. Aortic atherosclerosis and 3 vessel coronary artery calcifications. Mediastinum/Nodes: No enlarged mediastinal, hilar, or axillary lymph nodes. Thyroid gland, trachea, and esophagus demonstrate no significant findings. Lungs/Pleura: No pleural effusion or airspace consolidation. There is subsegmental atelectasis identified involving the anterior right middle lobe, image 79/12. Scar versus subsegmental atelectasis within the  anterior right lower lobe noted. Musculoskeletal: No acute or suspicious osseous findings. Spondylosis identified in the thoracic spine. Review of the MIP images confirms the above findings. CTA ABDOMEN AND PELVIS FINDINGS VASCULAR Aorta: Infra renal abdominal aortic aneurysm is identified measuring 3.8 cm in maximum AP diameter. There is eccentric mural thrombus within the aneurysmal dilated aorta measuring approximately 1.8 cm in thickness. After scratch set calcified at scratch set atherosclerotic calcifications noted throughout the aorta. No signs of dissection or significant stenosis. Celiac: Calcified plaque at the origin of the celiac artery results in approximately 50% stenosis. SMA: Patent without evidence of aneurysm, dissection, vasculitis or significant stenosis. Renals: Calcified plaque at the origin of the right renal artery results in approximately 40% stenosis. Left renal artery appears patent without significant stenosis. IMA: Patent without evidence of aneurysm, dissection, vasculitis or significant stenosis. Inflow: Patent without evidence of aneurysm, dissection, vasculitis or significant stenosis. Veins: No obvious venous abnormality within the limitations of this arterial phase study. Review of the MIP images confirms the above findings. NON-VASCULAR Hepatobiliary: No focal liver abnormality is seen. No gallstones, gallbladder wall thickening, or biliary dilatation. Pancreas: Unremarkable. No pancreatic ductal dilatation or surrounding inflammatory changes. Spleen: Normal in size without focal abnormality. Adrenals/Urinary Tract: Normal adrenal glands. Bosniak class 1 cyst arises off the upper pole of the right kidney measuring 4.2 cm. Bosniak class 1 cyst arising off the inferior pole of the left kidney measures 2.4 cm. Additional too small to reliably characterize low-attenuation kidney lesions are identified bilaterally compatible with Bosniak class 2 cyst. No follow-up imaging of these  kidney lesions  recommended. No kidney stones identified bilaterally. No suspicious mass or hydronephrosis. Urinary bladder is unremarkable. Stomach/Bowel: Indeterminate partially calcified exophytic lesion is noted along the greater curvature of the stomach with possible noncalcified in the luminal component. This measures 1.7 x 1.3 cm and is of uncertain clinical significance. The appendix is visualized and appears normal. No bowel wall thickening, inflammation, or distension. Lymphatic: No abdominopelvic adenopathy. Reproductive: Uterus and bilateral adnexa are unremarkable. Other: No free fluid or fluid collections. Musculoskeletal: Right-sided sacroiliitis is identified. Multilevel degenerative disc disease is identified throughout the lumbar spine. No acute or suspicious osseous findings. Bilateral hip osteoarthritis, right greater than left. Review of the MIP images confirms the above findings. IMPRESSION: 1. No evidence for aortic dissection. 2. 3.8 cm infrarenal abdominal aortic aneurysm. Recommend follow-up every 2 years. Reference: J Am Coll Radiol 1245;80:998-338. 3. Right-sided sacroiliitis. 4. Small, indeterminate partially calcified exophytic lesion is noted along the greater curvature of the stomach with possible noncalcified in the luminal component. This is of uncertain clinical significance, but is favored to represent a chronic abnormality. Consider follow-up with gastroenterology. 5. Aortic Atherosclerosis (ICD10-I70.0). Electronically Signed   By: Kerby Moors M.D.   On: 01/20/2022 15:06   CT Head Wo Contrast  Result Date: 01/20/2022 CLINICAL DATA:  Delirium EXAM: CT HEAD WITHOUT CONTRAST TECHNIQUE: Contiguous axial images were obtained from the base of the skull through the vertex without intravenous contrast. RADIATION DOSE REDUCTION: This exam was performed according to the departmental dose-optimization program which includes automated exposure control, adjustment of the mA and/or kV  according to patient size and/or use of iterative reconstruction technique. COMPARISON:  None Available. FINDINGS: Brain: Generalized atrophy. Moderate white matter hypodensity in the periventricular deep white matter bilaterally. Negative for acute infarct, hemorrhage, mass. Empty sella with enlargement of the sella filled with CSF. Vascular: Negative for hyperdense vessel Skull: Negative Sinuses/Orbits: Chronic fracture right maxillary sinus. Mucosal edema with calcification right maxillary sinus. Remaining sinuses clear. Bilateral cataract extraction Other: None IMPRESSION: Atrophy and chronic white matter changes.  No acute abnormality. Electronically Signed   By: Franchot Gallo M.D.   On: 01/20/2022 14:47   DG Chest Portable 1 View  Result Date: 01/20/2022 CLINICAL DATA:  Possible sepsis EXAM: PORTABLE CHEST 1 VIEW COMPARISON:  None Available. FINDINGS: Transverse diameter of heart is increased. There is aneurysmal dilation of thoracic aorta. There are no signs of pulmonary edema or focal pulmonary consolidation. Small linear densities are seen in lower lung fields. There is no significant pleural effusion or pneumothorax. Degenerative changes are noted in right shoulder. IMPRESSION: There is tortuosity and aneurysmal dilation of thoracic aorta. There are no signs of pulmonary edema or focal pulmonary consolidation. Small linear densities in the lower lung fields suggest scarring or subsegmental atelectasis. Electronically Signed   By: Elmer Picker M.D.   On: 01/20/2022 13:13    SIGNED: Deatra James, MD, FHM. [facility],  Pager (please use [email or web address] to page/text) Please use Epic Secure Chat for non-urgent communication (7AM-7PM)  If 7PM-7AM, please contact night-coverage [email or web address], 01/21/2022, 11:35 AM

## 2022-01-21 NOTE — Hospital Course (Signed)
Giada B Mcquary is a 88 y.o. female with medical history significant for HTN, Dementia, blind. [facility] of [facility] mental status, confusion, and reports that patient's urine had a strong smell, symptoms have been ongoing over the past 5 days.  Reportedly patient was somnolent to obtunded. Also with vomiting and poor oral intake.   Per ED provider, patient is blind, and does not talk.  At the time of evaluation, patient was awake, she says 1 word answers yes to all my questions, but unable to give further details to answers.   Per patient's daughter [name] on the phone, she is not aware of the diagnosis of dementia, at baseline patient has mild memory problems, but she can talk and answer questions.  Patient is wheelchair-bound.  She reports she called her mother yesterday on the phone and her mother appeared confused, repeating questions and answers.  She believes over the past 5 days patient has not been doing well.  Patient had 1 episode of vomiting today.  Patient complained about pain to her left backside yesterday.  Otherwise no cough no difficulty breathing no chest pain.   ED Course: Rectal temperature 100.7, HR 53, tachycardic to 112, respirate rate 18-37, blood pressure systolic 832-919, improved with IV hydralazine given.  RR 18-32.  O2 sats greater than 92% on room air. WBC 10.3.  Lactic acidosis 4.1 > 3.6. UA Not suggestive of infection. Head CT -  negative for acute abnormality. Chest x-ray showing tortuosity and aneurysmal dilation of thoracic aorta. Subsequent CTA chest abdomen and pelvis-without acute abnormality.  No evidence of aortic dissection, shows 3.8 cm abdominal aortic aneurysm, right-sided sacroiliitis, calcified lesion in the greater curvature of stomach. 10 mg Geodon given.  Hydralazine 10 mg given.  2.5 L bolus given. IV Vanco and ceftriaxone started.    Hospitalist to admit for possible sepsis of unknown source.

## 2022-01-21 NOTE — Assessment & Plan Note (Signed)
-   Very poor dentition visible, patient does not cooperate does not open his mouth for full examination -No tenderness visible swelling or drainage noted -Front to 2 to incisor darkish black in color, along with the gum lines -Obtaining CT maxillofacial to rule out any abscess -Possible source of infection, continue current antibiotics

## 2022-01-21 NOTE — Consult Note (Signed)
Consultation Note Date: 01/21/2022   Patient Name: Adriana Stevens  DOB: [birth year]  MRN: [id number]  Age / Sex: 88 y.o., female  PCP: Vidal Schwalbe, MD Referring Physician: Deatra James, MD  Reason for Consultation: Establishing goals of care  HPI/Patient Profile: 88 y.o. female  with past medical history of memory loss/dementia although daughter states she is unaware of this diagnosis, TIA, HTN/HLD, osteoarthritis, blind, admitted on 01/20/2022 with sepsis with significant lactic acidosis possible source infected poor dentition.   Clinical Assessment and Goals of Care: I have reviewed medical records including EPIC notes, labs and imaging, received report from RN, assessed the patient.  Adriana Stevens is lying in bed.  She is alert, making and somewhat keeping eye contact.  I believe that she can make her basic needs known.  Unfortunately, there is no family present at bedside at this time.  Bedside nursing staff is present attending to needs.  Call to daughter, Adriana Stevens, to discuss diagnosis prognosis, [place], EOL wishes, disposition and options.  No answer.  Voicemail message left.  Advanced directives, concepts specific to code status, artifical feeding and hydration, and rehospitalization were considered and discussed.  Adriana Stevens is DNR.  Palliative Care services outpatient were discussed.  Adriana Stevens has been active with [facility] for outpatient palliative since October 2022.  She was last seen November 16, 2021.  Discussed the importance of continued conversation with family and the medical providers regarding overall plan of care and treatment options, ensuring decisions are within the context of the patient's values and GOCs.  Questions and concerns were addressed.   The family was encouraged to call with questions or concerns.  PMT will continue to support holistically.  Conference with attending, bedside  nursing staff, transition of care team related to patient condition, needs, goals of care, disposition.   HCPOA NEXT OF KIN -daughter, Adriana Stevens.    SUMMARY OF RECOMMENDATIONS   At this point continue to treat the treatable but no CPR or intubation Anticipate returning to [place] Would benefit from palliative/hospice care  Code Status/Advance Care Planning: DNR  Symptom Management:  Per hospitalist, no additional needs at this time.  Palliative Prophylaxis:  Oral Care and Turn Reposition  Additional Recommendations (Limitations, Scope, Preferences): Treat the treatable but no CPR or intubation  Psycho-social/Spiritual:  Desire for further Chaplaincy support:no Additional Recommendations: Caregiving  Support/Resources and Education on Hospice  Prognosis:  Unable to determine, based on outcomes.  6 months or less would not be surprising based on chronic illness burden, decreasing functional status, advanced age  Discharge Planning: To Be Determined      Primary Diagnoses: Present on Admission:  SIRS (systemic inflammatory response syndrome) (HCC)  Essential hypertension  Blindness of right eye  Acute metabolic encephalopathy  Sepsis ([place])   I have reviewed the medical record, interviewed the patient and family, and examined the patient. The following aspects are pertinent.  Past Medical History:  Diagnosis Date   Cerebral infarction [facility])    Glaucoma  Hyperlipemia    Hypertension    Osteoarthritis    TIA (transient ischemic attack)    Social History   Socioeconomic History   Marital status: Unknown    Spouse name: Not on file   Number of children: Not on file   Years of education: Not on file   Highest education level: Not on file  Occupational History   Not on file  Tobacco Use   Smoking status: Never   Smokeless tobacco: Never  Substance and Sexual Activity   Alcohol use: No    Alcohol/week: 0.0 standard drinks of alcohol   Drug use:  No   Sexual activity: Not on file  Other Topics Concern   Not on file  Social History Narrative   Not on file   Social Determinants of Health   Financial Resource Strain: Low Risk  (08/01/2020)   Overall Financial Resource Strain (CARDIA)    Difficulty of Paying Living Expenses: Not hard at all  Food Insecurity: No Food Insecurity (08/01/2020)   Hunger Vital Sign    Worried About Running Out of Food in the Last Year: Never true    [place] in the Last Year: Never true  Transportation Needs: No Transportation Needs (08/01/2020)   PRAPARE - Hydrologist (Medical): No    Lack of Transportation (Non-Medical): No  Physical Activity: Inactive (08/01/2020)   Exercise Vital Sign    Days of Exercise per Week: 0 days    Minutes of Exercise per Session: 0 min  Stress: No Stress Concern Present (08/01/2020)   [place]    Feeling of Stress : Not at all  Social Connections: Moderately Isolated (08/01/2020)   Social Connection and Isolation Panel [NHANES]    Frequency of Communication with Friends and Family: More than three times a week    Frequency of Social Gatherings with Friends and Family: More than three times a week    Attends Religious Services: 1 to 4 times per year    Active Member of [employer] or Organizations: No    Attends Archivist Meetings: Never    Marital Status: Widowed   Family History  Problem Relation Age of Onset   Heart attack Mother    Stroke Father    Arthritis Sister    Hypertension Sister    Cancer Brother    Hypertension Brother    Scheduled Meds:  amLODipine  10 mg Oral Daily   enoxaparin (LOVENOX) injection  40 mg Subcutaneous Q24H   hydrALAZINE  10 mg Intravenous Q8H   Continuous Infusions:  sodium chloride 100 mL/hr at 01/21/22 1232   ceFEPime (MAXIPIME) IV Stopped (01/21/22 1207)   metronidazole 500 mg (01/21/22 0924)   vancomycin     PRN  Meds:.acetaminophen **OR** acetaminophen, haloperidol lactate, labetalol, ondansetron **OR** ondansetron (ZOFRAN) IV, polyethylene glycol Medications Prior to Admission:  Prior to Admission medications   Medication Sig Start Date End Date Taking? Authorizing Provider  acetaminophen (TYLENOL) 500 MG tablet Take 1,000 mg by mouth 3 (three) times daily.   Yes [provider]  alum & mag hydroxide-simeth (MAALOX/MYLANTA) 200-200-20 MG/5ML suspension Take 30 mLs by mouth every 6 (six) hours as needed for indigestion or heartburn.   Yes [provider]  Amino Acids-Protein Hydrolys (FEEDING SUPPLEMENT, PRO-STAT 64,) LIQD Take 30 mLs by mouth daily.   Yes [provider]  anastrozole (ARIMIDEX) 1 MG tablet Take 1 tablet (1 mg total)  by mouth daily. 08/01/20  Yes Derek Jack, MD  diclofenac Sodium (VOLTAREN) 1 % GEL See admin instructions.   Yes [provider]  docusate sodium (COLACE) 100 MG capsule Take 100 mg by mouth daily.   Yes [provider]  famotidine (PEPCID) 20 MG tablet Take 20 mg by mouth 2 (two) times daily. 06/26/20  Yes [provider]  gabapentin (NEURONTIN) 100 MG capsule Take 100 mg by mouth at bedtime. 11/30/20  Yes [provider]  hydroxypropyl methylcellulose / hypromellose (ISOPTO TEARS / GONIOVISC) 2.5 % ophthalmic solution 1 drop 4 (four) times daily.   Yes [provider]  latanoprost (XALATAN) 0.005 % ophthalmic solution SMARTSIG:In Eye(s) 07/10/20  Yes [provider]  loteprednol (LOTEMAX) 0.5 % ophthalmic suspension SMARTSIG:In Eye(s) 08/10/20  Yes [provider]  magnesium hydroxide (MILK OF MAGNESIA) 400 MG/5ML suspension Take 30 mLs by mouth daily as needed for mild constipation.   Yes [provider]  olmesartan-hydrochlorothiazide (BENICAR HCT) 40-12.5 MG tablet Take 1 tablet by mouth daily. 08/25/20  Yes [provider]  potassium chloride SA (KLOR-CON) 20 MEQ  tablet Take 20 mEq by mouth daily. 08/25/20  Yes [provider]  traZODone (DESYREL) 50 MG tablet Take 50 mg by mouth at bedtime as needed for sleep.   Yes [provider]  amLODipine (NORVASC) 10 MG tablet Take 10 mg by mouth daily.    [provider]  Ascorbic Acid 500 MG/15ML LIQD 1 tablet    [provider]  ASPERCREME LIDOCAINE 4 % PTCH Apply 1 patch topically daily. 04/03/21   [provider]  Magnesium 400 MG TABS Take 1 tablet by mouth at bedtime.    [provider]  melatonin 3 MG TABS tablet Take 3 mg by mouth at bedtime.    [provider]  Multiple Vitamin (TAB-A-VITE) TABS Take 1 tablet by mouth daily. 02/22/21   [provider]  nitroGLYCERIN (NITROSTAT) 0.4 MG SL tablet Place 0.4 mg under the tongue every 5 (five) minutes as needed for chest pain.    [provider]  senna (SENOKOT) 8.6 MG TABS tablet Take 2 tablets by mouth at bedtime.    [provider]  sodium chloride (MURO 128) 5 % ophthalmic solution SMARTSIG:In Eye(s) 05/15/21   [provider]  SYSTANE 0.4-0.3 % SOLN Apply to eye. 05/16/21   [provider]   No Known Allergies Review of Systems  Unable to perform ROS: Dementia    Physical Exam Vitals and nursing note reviewed.  Constitutional:      General: She is not in acute distress.    Appearance: She is ill-appearing.  Cardiovascular:     Rate and Rhythm: Normal rate.  Pulmonary:     Effort: Pulmonary effort is normal. No respiratory distress.  Skin:    General: Skin is warm and dry.  Neurological:     Mental Status: She is alert.     Comments: Oriented to self only      Vital Signs: BP (!) 155/85 (BP Location: Left Arm)   Pulse 72   Temp 98 F (36.7 C) (Oral)   Resp 17   Ht '5\' 5"'$  (1.651 m)   Wt 74.8 kg   SpO2 93%   BMI 27.46 kg/m          SpO2: SpO2: 93 % O2 Device:SpO2: 93 % O2 Flow Rate: .   IO: Intake/output summary:   Intake/Output Summary (Last 24 hours) at 01/21/2022 1504 Last data  filed at 01/21/2022 1300 Gross per 24 hour  Intake 2148.52 ml  Output 600 ml  Net 1548.52 ml    LBM:   Baseline Weight: Weight: 74.8 kg Most recent weight: Weight: 74.8 kg     Palliative Assessment/Data:   Flowsheet Rows    Flowsheet Row Most Recent Value  Intake Tab   Referral Department Hospitalist  Unit at Time of Referral Cardiac/Telemetry Unit  Palliative Care Primary Diagnosis Other (Comment)  Date Notified 01/21/22  Palliative Care Type Return patient Palliative Care  Reason for referral Clarify Goals of Care  Date of Admission 01/20/22  Date first seen by Palliative Care 01/21/22  # of days Palliative referral response time 0 Day(s)  # of days IP prior to Palliative referral 1  Clinical Assessment   Palliative Performance Scale Score 30%  Pain Max last 24 hours Not able to report  Pain Min Last 24 hours Not able to report  Dyspnea Max Last 24 Hours Not able to report  Dyspnea Min Last 24 hours Not able to report  Psychosocial & Spiritual Assessment   Palliative Care Outcomes        Time In: 1300 Time Out: 1355 Time Total: 55 minutes  Greater than 50%  of this time was spent counseling and coordinating care related to the above assessment and plan.  Signed by: Drue Novel, NP   Please contact Palliative Medicine Team phone at [phone] for questions and concerns.  For individual provider: See Shea Evans

## 2022-01-21 NOTE — TOC Initial Note (Signed)
Transition of Care [facility]) - Initial/Assessment Note    Patient Details  Name: Adriana Stevens MRN: [id number] Date of Birth: March 22, 1926  Transition of Care [facility]) CM/SW Contact:    Iona Beard, [place] Phone Number: 01/21/2022, 2:38 PM  Clinical Narrative:                 CSW noted per chart review that pt is a resident from [facility]. CSW reached out twice to [facility] in attempt to speak with Ginger in admission, CSW unable to make contact at this time. CSW spoke with pts daughter who states that pt has been resident at [facility] for a little over 2 years. Per pts daughter plan will be for return to [facility]. TOC to follow.   Expected Discharge Plan: Assisted Living Barriers to Discharge: Continued Medical Work up   Patient Goals and CMS Choice Patient states their goals for this hospitalization and ongoing recovery are:: return to ALF CMS Medicare.gov Compare Post Acute Care list provided to:: Patient Represenative (must comment) Choice offered to / list presented to : Adult Children  Expected Discharge Plan and Services Expected Discharge Plan: Assisted Living In-house Referral: Clinical Social Work Discharge Planning Services: CM Consult Post Acute Care Choice: Resumption of Svcs/PTA Provider Living arrangements for the past 2 months: Assisted Living Facility                                      Prior Living Arrangements/Services Living arrangements for the past 2 months: [place] Lives with:: Facility Resident Patient language and need for interpreter reviewed:: Yes Do you feel safe going back to the place where you live?: Yes      Need for Family Participation in Patient Care: Yes (Comment) Care giver support system in place?: Yes (comment)   Criminal Activity/Legal Involvement Pertinent to Current Situation/Hospitalization: No - Comment as needed  Activities of Daily Living      Permission Sought/Granted                   Emotional Assessment Appearance:: Appears stated age       Alcohol / Substance Use: Not Applicable Psych Involvement: No (comment)  Admission diagnosis:  Altered mental status, unspecified altered mental status type [J85.63] Acute metabolic encephalopathy [J49.70] AMS (altered mental status) [R41.82] Sepsis, due to unspecified organism, unspecified whether acute organ dysfunction present [facility]) [A41.9] Patient Active Problem List   Diagnosis Date Noted   Poor dentition 01/21/2022    Class: Chronic   Sepsis ([place]) 01/21/2022    Class: Acute   Hypokalemia 01/21/2022   SIRS (systemic inflammatory response syndrome) ([place]) 26/37/8588   Acute metabolic encephalopathy 50/27/7412   Abnormal gait 12/27/2020   Insomnia 12/27/2020   Mixed hyperlipidemia 12/27/2020   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 [place] [address] Phone: [phone] Fax: [phone]  [place], [address] [place] [address] Phone: [phone] Fax: [phone]  Social Determinants of Health (SDOH) Interventions    Readmission Risk Interventions     No data to display           

## 2022-01-22 ENCOUNTER — Inpatient Hospital Stay (HOSPITAL_COMMUNITY): Payer: Medicare Other

## 2022-01-22 DIAGNOSIS — H5440 Blindness, one eye, unspecified eye: Secondary | ICD-10-CM | POA: Diagnosis not present

## 2022-01-22 DIAGNOSIS — I10 Essential (primary) hypertension: Secondary | ICD-10-CM | POA: Diagnosis not present

## 2022-01-22 DIAGNOSIS — Z7189 Other specified counseling: Secondary | ICD-10-CM | POA: Diagnosis not present

## 2022-01-22 DIAGNOSIS — K089 Disorder of teeth and supporting structures, unspecified: Secondary | ICD-10-CM | POA: Diagnosis not present

## 2022-01-22 DIAGNOSIS — G9341 Metabolic encephalopathy: Secondary | ICD-10-CM | POA: Diagnosis not present

## 2022-01-22 DIAGNOSIS — A419 Sepsis, unspecified organism: Secondary | ICD-10-CM | POA: Diagnosis not present

## 2022-01-22 DIAGNOSIS — Z515 Encounter for palliative care: Secondary | ICD-10-CM | POA: Diagnosis not present

## 2022-01-22 DIAGNOSIS — E876 Hypokalemia: Secondary | ICD-10-CM

## 2022-01-22 DIAGNOSIS — R413 Other amnesia: Secondary | ICD-10-CM

## 2022-01-22 LAB — BASIC METABOLIC PANEL
Anion gap: 11 (ref 5–15)
BUN: 14 mg/dL (ref 8–23)
CO2: 22 mmol/L (ref 22–32)
Calcium: 8.2 mg/dL — ABNORMAL LOW (ref 8.9–10.3)
Chloride: 102 mmol/L (ref 98–111)
Creatinine, Ser: 0.89 mg/dL (ref 0.44–1.00)
GFR, Estimated: >60 mL/min (ref >60)
Glucose, Bld: 114 mg/dL — ABNORMAL HIGH (ref 70–99)
Potassium: 3.1 mmol/L — ABNORMAL LOW (ref 3.5–5.1)
Sodium: 135 mmol/L (ref 135–145)

## 2022-01-22 LAB — CBC
HCT: 38.9 % (ref 36.0–46.0)
Hemoglobin: 12.7 g/dL (ref 12.0–15.0)
MCH: 30 pg (ref 26.0–34.0)
MCHC: 32.6 g/dL (ref 30.0–36.0)
MCV: 91.7 fL (ref 80.0–100.0)
Platelets: 165 10*3/uL (ref 150–400)
RBC: 4.24 MIL/uL (ref 3.87–5.11)
RDW: 13.2 % (ref 11.5–15.5)
WBC: 8.9 10*3/uL (ref 4.0–10.5)
nRBC: 0 % (ref 0.0–0.2)

## 2022-01-22 LAB — PROCALCITONIN: Procalcitonin: <0.1 ng/mL

## 2022-01-22 LAB — MRSA NEXT GEN BY PCR, NASAL: MRSA by PCR Next Gen: NOT DETECTED

## 2022-01-22 MED ORDER — LABETALOL HCL 200 MG PO TABS
200.0000 mg | ORAL_TABLET | Freq: Two times a day (BID) | ORAL | Status: DC
  Administered 2022-01-22 – 2022-01-24 (×5): 200 mg via ORAL
  Filled 2022-01-22 (×5): qty 1

## 2022-01-22 MED ORDER — CLONIDINE HCL 0.2 MG/24HR TD PTWK
0.3000 mg | MEDICATED_PATCH | TRANSDERMAL | Status: DC
  Administered 2022-01-22: 0.3 mg via TRANSDERMAL
  Filled 2022-01-22: qty 1

## 2022-01-22 MED ORDER — POTASSIUM CHLORIDE CRYS ER 20 MEQ PO TBCR
20.0000 meq | EXTENDED_RELEASE_TABLET | Freq: Once | ORAL | Status: AC
  Administered 2022-01-22: 20 meq via ORAL
  Filled 2022-01-22: qty 1

## 2022-01-22 MED ORDER — SODIUM CHLORIDE 0.9 % IV BOLUS
1000.0000 mL | Freq: Once | INTRAVENOUS | Status: AC
  Administered 2022-01-22: 1000 mL via INTRAVENOUS

## 2022-01-22 MED ORDER — LORAZEPAM 2 MG/ML IJ SOLN
1.0000 mg | Freq: Once | INTRAMUSCULAR | Status: AC
  Administered 2022-01-22: 1 mg via INTRAVENOUS
  Filled 2022-01-22: qty 1

## 2022-01-22 MED ORDER — SODIUM CHLORIDE 0.9 % IV SOLN: INTRAVENOUS | Status: DC

## 2022-01-22 MED ORDER — POTASSIUM CHLORIDE IN NACL 20-0.9 MEQ/L-% IV SOLN: INTRAVENOUS | Status: AC

## 2022-01-22 MED ORDER — CLONIDINE HCL 0.2 MG/24HR TD PTWK: 0.2000 mg | MEDICATED_PATCH | TRANSDERMAL | Status: DC

## 2022-01-22 NOTE — Progress Notes (Signed)
Palliative: Adriana Stevens is resting quietly in bed.  She appears acutely/chronically ill and very frail.  She is resting comfortably as bedside nursing staff has given antianxiety medicines in preparation for MRI.  Adriana Stevens has known dementia, and is usually oriented to self only.  I do not believe that she can make her basic needs known.  There is no family at bedside at this time although her daughter, Adriana Stevens, was present this morning.  Bedside nursing staff is present attending to needs.  Call to daughter/healthcare surrogate, Adriana Stevens.  No answer, left somewhat detailed voicemail message.  Unfortunately MRI brain was nondiagnostic due to Adriana Stevens's inability to participate/remain still even with anxiolytics.   Conference with attending, bedside nursing staff, transition of care team related to patient condition, needs, goals of care, disposition.  Plan: At this point continue to treat the treatable but no CPR or intubation.  Time for outcomes.  Return to [place] where she has been a resident.  Active with [facility] outpatient palliative services.  Would benefit from transitioning to hospice care.     11 minutes Quinn Axe, NP Palliative medicine team Team phone [phone] Greater than 50% of this time was spent counseling and coordinating care related to the above assessment and plan.

## 2022-01-22 NOTE — Progress Notes (Signed)
[facility] [place] [facility]) Hospital Liaison note:  This patient is currently enrolled in [facility] outpatient-based Palliative Care. Will continue to follow for disposition.  Please call with any outpatient palliative questions or concerns.  Thank you, Lorelee Market, LPN [facility] Liaison [phone]

## 2022-01-22 NOTE — Progress Notes (Signed)
PROGRESS NOTE    Patient: Adriana Stevens                            PCP: Vidal Schwalbe, MD                    DOB: 1926/01/31            DOA: 01/20/2022 [id number]             DOS: 01/22/2022, 12:51 PM   LOS: 2 days   Date of Service: The patient was seen and examined on 01/22/2022  Subjective:   The patient was seen and examined this morning, pleasant, confused, speech intact, cognition poor, does not make sense with communication... Hypertensive otherwise hemodynamically stable, Lactic acid still 4.1, 3.6 this a.m., potassium 3.1, WBC normal at 8.9   Patient is now more awake, alert cooperative, oriented x1, poor cognition   Daughter -POA present at bedside stating that her communication is not at baseline Stating sleep cognition decline denying history of dementia   Brief Narrative:   Adriana Stevens is a 88 y.o. female with medical history significant for HTN, Dementia, blind. [facility] of altered mental status, confusion, and reports that patient's urine had a strong smell, symptoms have been ongoing over the past 5 days.  Reportedly patient was somnolent to obtunded. Also with vomiting and poor oral intake.   Per ED provider, patient is blind, and does not talk.  At the time of evaluation, patient was awake, she says 1 word answers yes to all my questions, but unable to give further details to answers.   Per patient's daughter Adriana Stevens on the phone, she is not aware of the diagnosis of dementia, at baseline patient has mild memory problems, but she can talk and answer questions.  Patient is wheelchair-bound.  She reports she called her mother yesterday on the phone and her mother appeared confused, repeating questions and answers.  She believes over the past 5 days patient has not been doing well.  Patient had 1 episode of vomiting today.  Patient complained about pain to her left backside yesterday.  Otherwise no cough no  difficulty breathing no chest pain.   ED Course: Rectal temperature 100.7, HR 53, tachycardic to 112, respirate rate 18-37, blood pressure systolic 409-735, improved with IV hydralazine given.  RR 18-32.  O2 sats greater than 92% on room air. WBC 10.3.  Lactic acidosis 4.1 > 3.6. UA Not suggestive of infection. Head CT -  negative for acute abnormality. Chest x-ray showing tortuosity and aneurysmal dilation of thoracic aorta. Subsequent CTA chest abdomen and pelvis-without acute abnormality.  No evidence of aortic dissection, shows 3.8 cm abdominal aortic aneurysm, right-sided sacroiliitis, calcified lesion in the greater curvature of stomach. 10 mg Geodon given.  Hydralazine 10 mg given.  2.5 L bolus given. IV Vanco and ceftriaxone started.    Hospitalist to admit for possible sepsis of unknown source.      Assessment & Plan:   Principal Problem:   Sepsis ([place]) Active Problems:   Poor dentition   Hypokalemia   SIRS (systemic inflammatory response syndrome) (HCC)   Essential hypertension   Blindness of right eye   Acute metabolic encephalopathy     Assessment and Plan: * Sepsis ([place]) - Improved sepsis physiology  Blood pressure (!) 182/90, pulse 73, temperature 98.7 F (37.1 C), RR18, weight 74.8 kg,  SpO2 98 %.   Met sepsis criteria on admissio change with fever 100.7, tachycardic heart rate 53 - 112, RR 18-32. With toxic metabolic encephalopathy  Significant lactic acidosis of 4.1 > 3.6.   - At this time no focus of infection identified, UA not suggestive of infection,  CTA chest pelvis and abdomen-no acute abnormality.  COVID test negative. -Possible source of infection poor dentition -CT maxillary facial>> ordered  -2.5 L bolus given, continue N/s 100cc/hr  -Continue broad-spectrum antibiotics IV vancomycin cefepime and metronidazole -Follow-up blood and urine cultures -If blood cultures remain negative for 24 hours then will de-escalate IV  antibiotics   Hypokalemia - Monitoring and repleting  Poor dentition - Very poor dentition visible, patient does not cooperate does not open his mouth for full examination -No tenderness visible swelling or drainage noted -Front to 2 to incisor darkish black in color, along with the gum lines -Obtaining CT maxillofacial to rule out any abscess -Possible source of infection, continue current antibiotics  Acute metabolic encephalopathy - Awake alert oriented x1, with a poor cognition -P.o. daughter at bedside stating that cognitively if she has decline, memory following the past  Likely due to SIRs, dehydration.  Head CT negative for acute abnormality.  Per nursing home, baseline dementia, but daughter tells me she is unaware of this.  Wheelchair-bound.  Able to move all extremities.  - - - Blind -MRI -Limited exam due to motion degradation, But;  Brain: No acute infarction, hemorrhage, hydrocephalus, -Fluids, antibiotics -Per previous history patient has been placed back on dysphagia diet  Blindness of right eye - Stable, chronic no changes per daughter  Essential hypertension - Etc. hypertension, noncompliant with meds -Currently titrating medication, added clonidine patch 0.3 mg,  - Hydralazine 10 mg x 1 given with improvement in blood pressure. -As needed labetalol 10 mg for systolic greater than 235 -Resume home medications Norvasc --- Per nursing she has been refusing her oral medications  - Holding losartan/HCTZ with contrast exposure -Since patient is noncompliant not taking oral medications, scheduled supplement IV  hydralazine for now   Ethics: Given the patient's age, likely underlying dementia, exacerbated by acute infection... Anticipating poor prognosis --confirmed DNR/DNI status  ----------------------------------------------------------------------------------------------------------------------------------------------- Nutritional status:  The patient's BMI is:  Body mass index is 27.46 kg/m. I agree with the assessment and plan as outlined ---------------------------------------------------------------------------------------------------------------------------------------------------- Cultures; Blood Cultures x 2 >> NGT Urine Culture  >>> NGT   ------------------------------------------------------------------------------------------------------------------------------------------------  DVT prophylaxis:  enoxaparin (LOVENOX) injection 40 mg Start: 01/21/22 2015   Code Status:   Code Status: DNR  Family Communication: POA daughter present at bedside updated Palliative care consulted to determine goals of care The above findings and plan of care has been discussed with patient (daughter) in detail,  they expressed understanding and agreement of above. -Advance care planning has been discussed.   Admission status:   Status is: Inpatient Remains inpatient appropriate because: Treating sepsis physiology, with IV fluids, broad-spectrum antibiotics     Procedures:   No admission procedures for hospital encounter.   Antimicrobials:  Anti-infectives (From admission, onward)    Start     Dose/Rate Route Frequency Ordered Stop   01/22/22 1915  vancomycin (VANCOREADY) IVPB 1250 mg/250 mL  Status:  Discontinued       See Hyperspace for full Linked Orders Report.   1,250 mg 166.7 mL/hr over 90 Minutes Intravenous Every 48 hours 01/20/22 1745 01/21/22 0826   01/21/22 1600  vancomycin (VANCOREADY) IVPB 750 mg/150 mL  See Hyperspace for full Linked Orders Report.   750 mg 150 mL/hr over 60 Minutes Intravenous Every 24 hours 01/21/22 0826     01/21/22 0830  ceFEPIme (MAXIPIME) 2 g in sodium chloride 0.9 % 100 mL IVPB        2 g 200 mL/hr over 30 Minutes Intravenous Every 12 hours 01/21/22 0826     01/20/22 2015  metroNIDAZOLE (FLAGYL) IVPB 500 mg        500 mg 100 mL/hr over 60 Minutes Intravenous Every 12 hours 01/20/22 1929      01/20/22 1900  vancomycin (VANCOREADY) IVPB 500 mg/100 mL       See Hyperspace for full Linked Orders Report.   500 mg 100 mL/hr over 60 Minutes Intravenous  Once 01/20/22 1745 01/21/22 1920   01/20/22 1800  ceFEPIme (MAXIPIME) 2 g in sodium chloride 0.9 % 100 mL IVPB  Status:  Discontinued        2 g 200 mL/hr over 30 Minutes Intravenous Every 24 hours 01/20/22 1745 01/21/22 0826   01/20/22 1530  vancomycin (VANCOCIN) IVPB 1000 mg/200 mL premix        1,000 mg 200 mL/hr over 60 Minutes Intravenous  Once 01/20/22 1522 01/20/22 1732   01/20/22 1315  cefTRIAXone (ROCEPHIN) 1 g in sodium chloride 0.9 % 100 mL IVPB        1 g 200 mL/hr over 30 Minutes Intravenous  Once 01/20/22 1312 01/20/22 1430        Medication:   amLODipine  10 mg Oral Daily   cloNIDine  0.3 mg Transdermal Weekly   enoxaparin (LOVENOX) injection  40 mg Subcutaneous Q24H   labetalol  200 mg Oral BID    acetaminophen **OR** acetaminophen, haloperidol **OR** haloperidol lactate, labetalol, ondansetron **OR** ondansetron (ZOFRAN) IV, polyethylene glycol   Objective:   Vitals:   01/21/22 1316 01/21/22 2100 01/22/22 0500 01/22/22 0621  BP: (!) 155/85 (!) 164/99 (!) 169/120 (!) 163/102  Pulse: 72 98 (!) 122 96  Resp: 17 19 18 18   Temp: 98 F (36.7 C) (!) 97.4 F (36.3 C) (!) 97.2 F (36.2 C) 99.2 F (37.3 C)  TempSrc: Oral Oral Axillary Oral  SpO2: 93% 100% (!) 89% 98%  Weight:      Height:        Intake/Output Summary (Last 24 hours) at 01/22/2022 1251 Last data filed at 01/22/2022 0444 Gross per 24 hour  Intake 466.46 ml  Output 700 ml  Net -233.54 ml   Filed Weights   01/20/22 1715  Weight: 74.8 kg     Examination:     Physical Exam:   General:  AAO x 1  cooperative, no distress; chronically ill looking blind female, Pleasantly demented  HEENT:  Blind, normocephalic, PERRL, otherwise with in Normal limits   Neuro:  CNII-XII intact. , normal motor and sensation, reflexes intact   Lungs:    Clear to auscultation BL, Respirations unlabored,  No wheezes / crackles  Cardio:    S1/S2, RRR, No murmure, No Rubs or Gallops   Abdomen:  Soft, non-tender, bowel sounds active all four quadrants, no guarding or peritoneal signs.  Muscular  skeletal:  Limited exam -global generalized weaknesses - in bed, able to move all 4 extremities,   2+ pulses,  symmetric, No pitting edema  Skin:  Dry, warm to touch, negative for any Rashes,  Wounds: Please see nursing documentation         per nursing documentation   ------------------------------------------------------------------------------------------------------------------------------------------  LABs:     Latest Ref Rng & Units 01/22/2022    5:07 AM 01/21/2022    5:50 AM 01/20/2022   12:30 PM  CBC  WBC 4.0 - 10.5 K/uL 8.9  10.0  10.3   Hemoglobin 12.0 - 15.0 g/dL 12.7  12.6  14.1   Hematocrit 36.0 - 46.0 % 38.9  38.9  45.0   Platelets 150 - 400 K/uL 165  183  192       Latest Ref Rng & Units 01/22/2022    5:07 AM 01/21/2022    5:50 AM 01/20/2022   12:30 PM  CMP  Glucose 70 - 99 mg/dL 114  106  152   BUN 8 - 23 mg/dL 14  12  17    Creatinine 0.44 - 1.00 mg/dL 0.89  0.93  1.24   Sodium 135 - 145 mmol/L 135  135  138   Potassium 3.5 - 5.1 mmol/L 3.1  3.1  3.7   Chloride 98 - 111 mmol/L 102  97  97   CO2 22 - 32 mmol/L 22  28  27    Calcium 8.9 - 10.3 mg/dL 8.2  8.4  9.5   Total Protein 6.5 - 8.1 g/dL   8.1   Total Bilirubin 0.3 - 1.2 mg/dL   0.7   Alkaline Phos 38 - 126 U/L   61   AST 15 - 41 U/L   20   ALT 0 - 44 U/L   10        Micro Results Recent Results (from the past 240 hour(s))  Urine Culture     Status: None   Collection Time: 01/20/22 11:57 AM   Specimen: Urine, Catheterized  Result Value Ref Range Status   Specimen Description   Final    URINE, CATHETERIZED Performed at [facility], [address] [place], [place] [address]    Special Requests   Final    NONE Performed at [facility], [address], [place], [place] [address]    Culture   Final    NO GROWTH Performed at [facility], [place] [address], [place], [place] [address]    Report Status 01/21/2022 FINAL  Final  Blood Culture (routine x 2)     Status: None (Preliminary result)   Collection Time: 01/20/22 12:23 PM   Specimen: BLOOD  Result Value Ref Range Status   Specimen Description BLOOD LEFT ANTECUBITAL  Final   Special Requests   Final    BOTTLES DRAWN AEROBIC ONLY Blood Culture adequate volume   Culture   Final    NO GROWTH 2 DAYS Performed at [facility], [address], [place], [place] [address]    Report Status PENDING  Incomplete  Blood Culture (routine x 2)     Status: None (Preliminary result)   Collection Time: 01/20/22 12:40 PM   Specimen: BLOOD  Result Value Ref Range Status   Specimen Description BLOOD BLOOD LEFT HAND  Final   Special Requests   Final    BOTTLES DRAWN AEROBIC ONLY Blood Culture adequate volume   Culture   Final    NO GROWTH 2 DAYS Performed at [facility], [address], [place], [place] [address]    Report Status PENDING  Incomplete  Resp Panel by RT-PCR (Flu A&B, Covid) Anterior Nasal Swab     Status: None   Collection Time: 01/20/22 12:45 PM   Specimen: Anterior Nasal Swab  Result Value Ref Range Status   SARS Coronavirus 2  by RT PCR NEGATIVE NEGATIVE Final    Comment: (NOTE) SARS-CoV-2 target nucleic acids are NOT DETECTED.  The SARS-CoV-2 RNA is generally detectable in upper respiratory specimens during the acute phase of infection. The lowest concentration of SARS-CoV-2 viral copies this assay can detect is 138 copies/mL. A negative result does not preclude SARS-Cov-2 infection and should not be used as the sole basis for treatment or other patient management decisions. A negative result may occur with  improper specimen collection/handling, submission of specimen other than nasopharyngeal swab, presence of viral mutation(s) within the areas targeted by this  assay, and inadequate number of viral copies(<138 copies/mL). A negative result must be combined with clinical observations, patient history, and epidemiological information. The expected result is Negative.  Fact Sheet for Patients:  EntrepreneurPulse.com.au  Fact Sheet for Healthcare Providers:  IncredibleEmployment.be  This test is no t yet approved or cleared by the [place] FDA and  has been authorized for detection and/or diagnosis of SARS-CoV-2 by FDA under an Emergency Use Authorization (EUA). This EUA will remain  in effect (meaning this test can be used) for the duration of the COVID-19 declaration under Section 564(b)(1) of the Act, 21 U.S.C.section 360bbb-3(b)(1), unless the authorization is terminated  or revoked sooner.       Influenza A by PCR NEGATIVE NEGATIVE Final   Influenza B by PCR NEGATIVE NEGATIVE Final    Comment: (NOTE) The Xpert Xpress SARS-CoV-2/FLU/RSV plus assay is intended as an aid in the diagnosis of influenza from Nasopharyngeal swab specimens and should not be used as a sole basis for treatment. Nasal washings and aspirates are unacceptable for Xpert Xpress SARS-CoV-2/FLU/RSV testing.  Fact Sheet for Patients: EntrepreneurPulse.com.au  Fact Sheet for Healthcare Providers: IncredibleEmployment.be  This test is not yet approved or cleared by the [place] FDA and has been authorized for detection and/or diagnosis of SARS-CoV-2 by FDA under an Emergency Use Authorization (EUA). This EUA will remain in effect (meaning this test can be used) for the duration of the COVID-19 declaration under Section 564(b)(1) of the Act, 21 U.S.C. section 360bbb-3(b)(1), unless the authorization is terminated or revoked.  Performed at [facility], [address] [place], [place] [address]   MRSA Next Gen by PCR, Nasal     Status: None   Collection Time: 01/22/22  7:14 AM    Specimen: Nasal Mucosa; Nasal Swab  Result Value Ref Range Status   MRSA by PCR Next Gen NOT DETECTED NOT DETECTED Final    Comment: (NOTE) The GeneXpert MRSA Assay (FDA approved for NASAL specimens only), is one component of a comprehensive MRSA colonization surveillance program. It is not intended to diagnose MRSA infection nor to guide or monitor treatment for MRSA infections. Test performance is not FDA approved in patients less than 37 years old. Performed at [facility], [address] [place], [place] [address]     Radiology Reports MR BRAIN WO CONTRAST  Result Date: 01/22/2022 CLINICAL DATA:  Altered mental status. EXAM: MRI HEAD WITHOUT CONTRAST TECHNIQUE: Multiplanar, multiecho pulse sequences of the brain and surrounding structures were obtained without intravenous contrast. COMPARISON:  CT head 01/21/2019 FINDINGS: Markedly limited exam due to patient motion and noncompliance secondary to changes in mental status. At the time of interpretation only the axial diffusion and T2 weighted, sagittal T1 weighted, and coronal diffusion weighted sequences were available. The axial T2 weighted and sagittal T1 weighted sequences are markedly limited due to motion artifact. Within these limitations - Brain: No acute infarction, hemorrhage,  hydrocephalus, extra-axial collection. Vascular: Nondiagnostic. Skull and upper cervical spine: Nondiagnostic. Sinuses/Orbits: Right maxillary sinus muscoal thickening. Other: None. IMPRESSION: Essentially nondiagnostic exam. No definite abnormality identified. Recommend repeat exam when the patient is able to tolerate an MRI. Otherwise a CT brain could be obtained if there is clinical concern for an acute abnormality. Electronically Signed   By: Marin Roberts M.D.   On: 01/22/2022 12:40    SIGNED: Deatra James, MD, FHM. [facility],  Pager (please use [email or web address] to page/text) Please use Epic Secure Chat for non-urgent communication (7AM-7PM)  If  7PM-7AM, please contact night-coverage [email or web address], 01/22/2022, 12:51 PM

## 2022-01-22 NOTE — TOC Progression Note (Addendum)
Transition of Care [facility]) - Progression Note    Patient Details  Name: Adriana Stevens MRN: [id number] Date of Birth: 02-14-1926  [facility] CM/SW Contact  Boneta Lucks, RN Phone Number: 01/22/2022, 11:18 AM  Clinical Narrative:   Patient is active with [facility].  Patient has advanced dementia, from [facility]. Plan is to return there when stable for discharge.  FL2 reviewed with coordinator at [facility]. Sent message to [facility] will follow up with patient.    Expected Discharge Plan: Assisted Living Barriers to Discharge: Continued Medical Work up  Expected Discharge Plan and Services Expected Discharge Plan: Assisted Living In-house Referral: Clinical Social Work Discharge Planning Services: CM Consult Post Acute Care Choice: Resumption of Svcs/PTA Provider Living arrangements for the past 2 months: [place]

## 2022-01-22 NOTE — Evaluation (Signed)
Clinical/Bedside Swallow Evaluation Patient Details  Name: Adriana Stevens MRN: [id number] Date of Birth: [birth year]  Today's Date: 01/22/2022 Time: SLP Start Time (ACUTE ONLY): 64 SLP Stop Time (ACUTE ONLY): 1604 SLP Time Calculation (min) (ACUTE ONLY): 14 min  Past Medical History:  Past Medical History:  Diagnosis Date   Cerebral infarction ([place])    Glaucoma    Hyperlipemia    Hypertension    Osteoarthritis    TIA (transient ischemic attack)    Past Surgical History:  Past Surgical History:  Procedure Laterality Date   EYE SURGERY N/A    HPI:  88 y.o. female  with past medical history of memory loss/dementia although daughter states she is unaware of this diagnosis, TIA, HTN/HLD, osteoarthritis, blind, admitted on 01/20/2022 with sepsis with significant lactic acidosis possible source infected poor dentition. BSE requested.    Assessment / Plan / Recommendation  Clinical Impression  Clinical swallowing evaluation completed while Pt was sitting upright in bed; Pt was easily roused, however she was lethargic throughout my evaluation and note fluctuating alertness. Dysphagia seems to be cognitive based; Pt demonstrated poor awareness of liquid presentations despite verbal prompting and cues, oral holding of liquids and purees and requiring max verbal cues to swallow and clear oral cavity. Recommend downgrade Pt's diet to D1/puree and continue with thin liquids, with precautions re: DO NOT FEED if Pt is not alert and responsive, verbally prep Pt when giving her a bite or sip ( Pt is blind), provide verbal cues for Pt to swallow and ensure oral cavity is clear after feeding any presentation. ST will continue to follow acutely. Thank you, SLP Visit Diagnosis: Dysphagia, unspecified (R13.10)    Aspiration Risk  Mild aspiration risk;Moderate aspiration risk;Risk for inadequate nutrition/hydration    Diet Recommendation Dysphagia 1 (Puree);Thin liquid   Liquid Administration via:  Cup;Straw Medication Administration: Crushed with puree Compensations: Minimize environmental distractions;Slow rate;Small sips/bites Postural Changes: Seated upright at 90 degrees    Other  Recommendations Oral Care Recommendations: Oral care BID    Recommendations for follow up therapy are one component of a multi-disciplinary discharge planning process, led by the attending physician.  Recommendations may be updated based on patient status, additional functional criteria and insurance authorization.  Follow up Recommendations Skilled nursing-short term rehab (<3 hours/day)      Assistance Recommended at Discharge Frequent or constant Supervision/Assistance  Functional Status Assessment    Frequency and Duration min 2x/week  1 week       Prognosis Prognosis for Safe Diet Advancement: Fair Barriers to Reach Goals: Cognitive deficits      Swallow Study   General HPI: 88 y.o. female  with past medical history of memory loss/dementia although daughter states she is unaware of this diagnosis, TIA, HTN/HLD, osteoarthritis, blind, admitted on 01/20/2022 with sepsis with significant lactic acidosis possible source infected poor dentition. BSE requested. Type of Study: Bedside Swallow Evaluation Previous Swallow Assessment: n/a Diet Prior to this Study: Dysphagia 3 (soft);Thin liquids Temperature Spikes Noted: No History of Recent Intubation: No Behavior/Cognition: Alert;Cooperative;Pleasant mood Oral Cavity Assessment: Within Functional Limits Oral Care Completed by SLP: Recent completion by staff Self-Feeding Abilities: Total assist Patient Positioning: Upright in bed Baseline Vocal Quality: Normal Volitional Swallow: Unable to elicit    Oral/Motor/Sensory Function Overall Oral Motor/Sensory Function: Within functional limits   Ice Chips Ice chips: Not tested   Thin Liquid Thin Liquid: Impaired Presentation: Cup;Straw Oral Phase Impairments: Poor awareness of bolus;Reduced  lingual movement/coordination Oral Phase Functional Implications: Oral holding  Pharyngeal  Phase Impairments: Multiple swallows    Nectar Thick Nectar Thick Liquid: Not tested   Honey Thick Honey Thick Liquid: Not tested   Puree Puree: Impaired Presentation: Spoon Oral Phase Impairments: Poor awareness of bolus;Reduced lingual movement/coordination Oral Phase Functional Implications: Oral holding Pharyngeal Phase Impairments: Suspected delayed Swallow   Solid     Solid: Not tested     Adriana Stevens, CCC-SLP Speech Language Pathologist  Adriana Stevens 01/22/2022,5:00 PM

## 2022-01-23 DIAGNOSIS — Z7189 Other specified counseling: Secondary | ICD-10-CM | POA: Diagnosis not present

## 2022-01-23 DIAGNOSIS — Z515 Encounter for palliative care: Secondary | ICD-10-CM | POA: Diagnosis not present

## 2022-01-23 DIAGNOSIS — G9340 Encephalopathy, unspecified: Secondary | ICD-10-CM | POA: Diagnosis not present

## 2022-01-23 DIAGNOSIS — A419 Sepsis, unspecified organism: Secondary | ICD-10-CM | POA: Diagnosis not present

## 2022-01-23 DIAGNOSIS — R6520 Severe sepsis without septic shock: Secondary | ICD-10-CM | POA: Diagnosis not present

## 2022-01-23 LAB — BASIC METABOLIC PANEL
Anion gap: 6 (ref 5–15)
BUN: 20 mg/dL (ref 8–23)
CO2: 23 mmol/L (ref 22–32)
Calcium: 7.8 mg/dL — ABNORMAL LOW (ref 8.9–10.3)
Chloride: 110 mmol/L (ref 98–111)
Creatinine, Ser: 1.15 mg/dL — ABNORMAL HIGH (ref 0.44–1.00)
GFR, Estimated: 44 mL/min — ABNORMAL LOW (ref >60)
Glucose, Bld: 102 mg/dL — ABNORMAL HIGH (ref 70–99)
Potassium: 3.2 mmol/L — ABNORMAL LOW (ref 3.5–5.1)
Sodium: 139 mmol/L (ref 135–145)

## 2022-01-23 MED ORDER — AMOXICILLIN 250 MG/5ML PO SUSR
500.0000 mg | Freq: Three times a day (TID) | ORAL | Status: DC
  Administered 2022-01-23 – 2022-01-24 (×3): 500 mg via ORAL
  Filled 2022-01-23 (×6): qty 10

## 2022-01-23 MED ORDER — LACTATED RINGERS IV SOLN: INTRAVENOUS | Status: AC

## 2022-01-23 MED ORDER — POTASSIUM CHLORIDE CRYS ER 20 MEQ PO TBCR
40.0000 meq | EXTENDED_RELEASE_TABLET | Freq: Once | ORAL | Status: AC
  Administered 2022-01-23: 40 meq via ORAL
  Filled 2022-01-23: qty 2

## 2022-01-23 NOTE — Progress Notes (Signed)
Palliative: Adriana Stevens is resting quietly in bed.  She is resting comfortably, but wakes easily when I call her name.  She has known dementia, therefore I do not ask orientation questions.  She is calm and cooperative, not fearful at this time.  I do not believe that she can make her basic needs known.  Unfortunately, daughter has left bedside.  Call to daughter, [name], unable to reach, no voicemail message left.  Conference with attending, bedside nursing staff, transition of care team related to patient condition, needs, goals of care, disposition.  Plan: At this point continue to treat the treatable but no CPR or intubation.  Short-term rehab with the goal of returning to [place] where she has been a resident.  She is active with [facility] outpatient palliative services, and they are aware of her admission.  25 minutes Quinn Axe, NP Palliative medicine team Team phone [phone] Greater than 50% of this time was spent counseling and coordinating care related to the above assessment and plan.

## 2022-01-23 NOTE — Progress Notes (Signed)
Pharmacy Antibiotic Note  Adriana Stevens is a 88 y.o. female admitted on 01/20/2022 with  unknown source .  Pharmacy has been consulted for Vancomycin and cefepime dosing.  Plan: Cefepime 2gm IV q12h F/U cxs and clinical progress Monitor V/S, labs, and levels as indicated  Height: '5\' 5"'$  (165.1 cm) Weight: 74.8 kg (165 lb) IBW/kg (Calculated) : 57  Temp (24hrs), Avg:98.6 F (37 C), Min:97.9 F (36.6 C), Max:99.2 F (37.3 C)  Recent Labs  Lab 01/20/22 1230 01/20/22 1240 01/20/22 1450 01/20/22 1805 01/21/22 0550 01/22/22 0507 01/23/22 0434  WBC 10.3  --   --   --  10.0 8.9 9.0  CREATININE 1.24*  --   --   --  0.93 0.89 1.15*  LATICACIDVEN  --  4.1* 3.6* 1.7  --   --   --      Estimated Creatinine Clearance: 30.3 mL/min (A) (by C-G formula based on SCr of 1.15 mg/dL (H)).    No Known Allergies  Antimicrobials this admission: Vancomycin 9/17 >> 9/19 Cefepime 9/17 >>  Flagyl 9/17 >>  Microbiology results: 9/17 BCx: ngtd 9/17 UCx: ng  MRSA PCR: neg  Thank you for allowing pharmacy to be a part of this patient's care.  Margot Ables, PharmD Clinical Pharmacist 01/23/2022 9:40 AM

## 2022-01-23 NOTE — Progress Notes (Signed)
Speech Language Pathology Treatment: Dysphagia  Patient Details Name: Adriana Stevens MRN: [id number] DOB: 06-19-1925 Today's Date: 01/23/2022 Time: 9509-3267 SLP Time Calculation (min) (ACUTE ONLY): 23 min  Assessment / Plan / Recommendation Clinical Impression  Ongoing dysphagia therapy provided while Pt was sitting upright in bed; Pt was very alert, verbally responsive and pleasant during treatment today. Pt was provided thin liquids and puree textures without overt s/sx of aspiration. SLP provided a trial regular/cracker and demonstrated prolonged time and note oral residue after the swallow. [name] arrived during my treatment and reports Pt is primarily on soft foods (sounds as though mostly purees) at at home. Recommend continue with D1/puree diet and thin liquids. Recommend crushing meds with puree. ST will sign off at this time, Thank you.   HPI HPI: 88 y.o. female  with past medical history of memory loss/dementia although daughter states she is unaware of this diagnosis, TIA, HTN/HLD, osteoarthritis, blind, admitted on 01/20/2022 with sepsis with significant lactic acidosis possible source infected poor dentition. BSE requested.      SLP Plan  All goals met      Recommendations for follow up therapy are one component of a multi-disciplinary discharge planning process, led by the attending physician.  Recommendations may be updated based on patient status, additional functional criteria and insurance authorization.    Recommendations  Diet recommendations: Dysphagia 1 (puree);Thin liquid Liquids provided via: Cup;Straw Medication Administration: Crushed with puree Supervision: Full supervision/cueing for compensatory strategies Compensations: Minimize environmental distractions;Slow rate;Small sips/bites Postural Changes and/or Swallow Maneuvers: Seated upright 90 degrees                Oral Care Recommendations: Oral care BID Follow Up Recommendations: Skilled  nursing-short term rehab (<3 hours/day) Assistance recommended at discharge: Frequent or constant Supervision/Assistance SLP Visit Diagnosis: Dysphagia, unspecified (R13.10) Plan: All goals met          Adriana Stevens H. Roddie Mc, CCC-SLP Speech Language Pathologist  Wende Bushy  01/23/2022, 4:54 PM

## 2022-01-23 NOTE — Progress Notes (Signed)
PROGRESS NOTE    Adriana Stevens  MOQ:[id number] DOB: 26-Nov-1925 DOA: 01/20/2022 PCP: Vidal Schwalbe, MD   Brief Narrative:  Adriana Stevens is a 88 y.o. female with medical history significant for HTN, Dementia, blind. [facility] of altered mental status, confusion, and reports that patient's urine had a strong smell, symptoms have been ongoing over the past 5 days.  Reportedly patient was somnolent to obtunded. Also with vomiting and poor oral intake.   Per ED provider, patient is blind, and does not talk.  At the time of evaluation, patient was awake, she says 1 word answers yes to all my questions, but unable to give further details to answers.   Per patient's daughter Adriana Stevens on the phone, she is not aware of the diagnosis of dementia, at baseline patient has mild memory problems, but she can talk and answer questions.  Patient is wheelchair-bound.  She reports she called her mother yesterday on the phone and her mother appeared confused, repeating questions and answers.  She believes over the past 5 days patient has not been doing well.  Patient had 1 episode of vomiting today.  Patient complained about pain to her left backside yesterday.  Otherwise no cough no difficulty breathing no chest pain.   ED Course: Rectal temperature 100.7, HR 53, tachycardic to 112, respirate rate 18-37, blood pressure systolic 354-656, improved with IV hydralazine given.  RR 18-32.  O2 sats greater than 92% on room air. WBC 10.3.  Lactic acidosis 4.1 > 3.6. UA Not suggestive of infection. Head CT -  negative for acute abnormality. Chest x-ray showing tortuosity and aneurysmal dilation of thoracic aorta. Subsequent CTA chest abdomen and pelvis-without acute abnormality.  No evidence of aortic dissection, shows 3.8 cm abdominal aortic aneurysm, right-sided sacroiliitis, calcified lesion in the greater curvature of stomach. 10 mg Geodon given.  Hydralazine 10 mg  given.  2.5 L bolus given. IV Vanco and ceftriaxone started.    Hospitalist to admit for possible sepsis of unknown source.      Assessment & Plan:   Principal Problem:   Sepsis ([place]) Active Problems:   Poor dentition   Hypokalemia   SIRS (systemic inflammatory response syndrome) (HCC)   Essential hypertension   Blindness of right eye   Acute metabolic encephalopathy  Assessment and Plan:   Sepsis ([place]) - Improved sepsis physiology   Blood pressure (!) 182/90, pulse 73, temperature 98.7 F (37.1 C), RR18, weight 74.8 kg, SpO2 98 %.     Met sepsis criteria on admissio change with fever 100.7, tachycardic heart rate 53 - 112, RR 18-32. With toxic metabolic encephalopathy   Significant lactic acidosis of 4.1 > 3.6.   - At this time no focus of infection identified, UA not suggestive of infection,  CTA chest pelvis and abdomen-no acute abnormality.  COVID test negative. -Possible source of infection poor dentition -CT maxillary facial>> WNL   -2.5 L bolus given, continue N/s 100cc/hr  -Blood and urine cultures negative with no obvious source of infection noted, de-escalate antibiotics to oral amoxicillin to complete total 7-day course     Hypokalemia - Monitoring and repleting   Poor dentition - Very poor dentition visible, patient does not cooperate does not open his mouth for full examination -No tenderness visible swelling or drainage noted -Front to 2 to incisor darkish black in color, along with the gum lines -Obtaining CT maxillofacial to rule out any abscess -No source of infection,  but continue abx with amoxicillin as noted for 7 days   Acute metabolic encephalopathy - Awake alert oriented x1, with a poor cognition -P.o. daughter at bedside stating that cognitively if she has decline, memory following the past   Likely due to SIRs, dehydration.  Head CT negative for acute abnormality.  Per nursing home, baseline dementia, but daughter tells me she is  unaware of this.  Wheelchair-bound.  Able to move all extremities.  - - - Blind -MRI -Limited exam due to motion degradation, But;  Brain: No acute infarction, hemorrhage, hydrocephalus, -Fluids, antibiotics -Per previous history patient has been placed back on dysphagia diet   Blindness of right eye - Stable, chronic no changes per daughter   Essential hypertension - Etc. hypertension, noncompliant with meds -Currently titrating medication, added clonidine patch 0.3 mg,  - Hydralazine 10 mg x 1 given with improvement in blood pressure. -As needed labetalol 10 mg for systolic greater than 416 -Resume home medications Norvasc --- Per nursing she has been refusing her oral medications   - Holding losartan/HCTZ with contrast exposure -Since patient is noncompliant not taking oral medications, scheduled supplement IV  hydralazine for now     Ethics: Given the patient's age, likely underlying dementia, exacerbated by acute infection... Anticipating poor prognosis --confirmed DNR/DNI status    DVT prophylaxis:Lovenox Code Status: Full Family Communication: Daughter at bedside 9/20 Disposition Plan:  Status is: Inpatient Remains inpatient appropriate because: IV medications and altered mentation  Consultants:  Palliative  Procedures:  None  Antimicrobials:  Anti-infectives (From admission, onward)    Start     Dose/Rate Route Frequency Ordered Stop   01/22/22 1915  vancomycin (VANCOREADY) IVPB 1250 mg/250 mL  Status:  Discontinued       See Hyperspace for full Linked Orders Report.   1,250 mg 166.7 mL/hr over 90 Minutes Intravenous Every 48 hours 01/20/22 1745 01/21/22 0826   01/21/22 1600  vancomycin (VANCOREADY) IVPB 750 mg/150 mL  Status:  Discontinued       See Hyperspace for full Linked Orders Report.   750 mg 150 mL/hr over 60 Minutes Intravenous Every 24 hours 01/21/22 0826 01/23/22 0816   01/21/22 0830  ceFEPIme (MAXIPIME) 2 g in sodium chloride 0.9 % 100 mL IVPB         2 g 200 mL/hr over 30 Minutes Intravenous Every 12 hours 01/21/22 0826     01/20/22 2015  metroNIDAZOLE (FLAGYL) IVPB 500 mg        500 mg 100 mL/hr over 60 Minutes Intravenous Every 12 hours 01/20/22 1929     01/20/22 1900  vancomycin (VANCOREADY) IVPB 500 mg/100 mL       See Hyperspace for full Linked Orders Report.   500 mg 100 mL/hr over 60 Minutes Intravenous  Once 01/20/22 1745 01/21/22 1920   01/20/22 1800  ceFEPIme (MAXIPIME) 2 g in sodium chloride 0.9 % 100 mL IVPB  Status:  Discontinued        2 g 200 mL/hr over 30 Minutes Intravenous Every 24 hours 01/20/22 1745 01/21/22 0826   01/20/22 1530  vancomycin (VANCOCIN) IVPB 1000 mg/200 mL premix        1,000 mg 200 mL/hr over 60 Minutes Intravenous  Once 01/20/22 1522 01/20/22 1732   01/20/22 1315  cefTRIAXone (ROCEPHIN) 1 g in sodium chloride 0.9 % 100 mL IVPB        1 g 200 mL/hr over 30 Minutes Intravenous  Once 01/20/22 1312 01/20/22 1430  Subjective: Patient seen and evaluated today with no new acute complaints or concerns. No acute concerns or events noted overnight.  Still continues to remain confused per daughter at bedside and not at baseline.  Objective: Vitals:   01/22/22 1437 01/22/22 2126 01/23/22 0549 01/23/22 1349  BP: (!) 143/65 (!) 165/85 (!) 154/82 (!) 115/50  Pulse: 71 78 70 72  Resp: (!) 22 20 18  (!) 22  Temp: 98.7 F (37.1 C) 97.9 F (36.6 C) 99.2 F (37.3 C) 98.6 F (37 C)  TempSrc: Oral   Oral  SpO2: 97% 97% 93% 98%  Weight:      Height:        Intake/Output Summary (Last 24 hours) at 01/23/2022 1416 Last data filed at 01/23/2022 1300 Gross per 24 hour  Intake 1677.86 ml  Output 500 ml  Net 1177.86 ml   Filed Weights   01/20/22 1715  Weight: 74.8 kg    Examination:  General exam: Appears calm and comfortable, confused Respiratory system: Clear to auscultation. Respiratory effort normal. Cardiovascular system: S1 & S2 heard, RRR.  Gastrointestinal system: Abdomen is  soft Central nervous system: Alert and awake Extremities: No edema Skin: No significant lesions noted Psychiatry: Flat affect.    Data Reviewed: I have personally reviewed following labs and imaging studies  CBC: Recent Labs  Lab 01/20/22 1230 01/21/22 0550 01/22/22 0507 01/23/22 0434  WBC 10.3 10.0 8.9 9.0  NEUTROABS 7.8*  --   --   --   HGB 14.1 12.6 12.7 10.1*  HCT 45.0 38.9 38.9 31.3*  MCV 95.7 92.0 91.7 92.6  PLT 192 183 165 017   Basic Metabolic Panel: Recent Labs  Lab 01/20/22 1230 01/21/22 0550 01/22/22 0507 01/23/22 0434  NA 138 135 135 139  K 3.7 3.1* 3.1* 3.2*  CL 97* 97* 102 110  CO2 27 28 22 23   GLUCOSE 152* 106* 114* 102*  BUN 17 12 14 20   CREATININE 1.24* 0.93 0.89 1.15*  CALCIUM 9.5 8.4* 8.2* 7.8*   GFR: Estimated Creatinine Clearance: 30.3 mL/min (A) (by C-G formula based on SCr of 1.15 mg/dL (H)). Liver Function Tests: Recent Labs  Lab 01/20/22 1230  AST 20  ALT 10  ALKPHOS 61  BILITOT 0.7  PROT 8.1  ALBUMIN 4.2   No results for input(s): "LIPASE", "AMYLASE" in the last 168 hours. No results for input(s): "AMMONIA" in the last 168 hours. Coagulation Profile: Recent Labs  Lab 01/20/22 1230  INR 1.0   Cardiac Enzymes: No results for input(s): "CKTOTAL", "CKMB", "CKMBINDEX", "TROPONINI" in the last 168 hours. BNP (last 3 results) No results for input(s): "PROBNP" in the last 8760 hours. HbA1C: No results for input(s): "HGBA1C" in the last 72 hours. CBG: No results for input(s): "GLUCAP" in the last 168 hours. Lipid Profile: No results for input(s): "CHOL", "HDL", "LDLCALC", "TRIG", "CHOLHDL", "LDLDIRECT" in the last 72 hours. Thyroid Function Tests: No results for input(s): "TSH", "T4TOTAL", "FREET4", "T3FREE", "THYROIDAB" in the last 72 hours. Anemia Panel: No results for input(s): "VITAMINB12", "FOLATE", "FERRITIN", "TIBC", "IRON", "RETICCTPCT" in the last 72 hours. Sepsis Labs: Recent Labs  Lab 01/20/22 1230  01/20/22 1240 01/20/22 1450 01/20/22 1805 01/21/22 0550 01/22/22 0507  PROCALCITON <0.10  --   --   --  <0.10 <0.10  LATICACIDVEN  --  4.1* 3.6* 1.7  --   --     Recent Results (from the past 240 hour(s))  Urine Culture     Status: None   Collection Time: 01/20/22 11:57  AM   Specimen: Urine, Catheterized  Result Value Ref Range Status   Specimen Description   Final    URINE, CATHETERIZED Performed at [facility], [address] [place], [place] [address]    Special Requests   Final    NONE Performed at [facility], [address], [place], [place] [address]    Culture   Final    NO GROWTH Performed at [facility], [place] [address] [place], [place] [address]    Report Status 01/21/2022 FINAL  Final  Blood Culture (routine x 2)     Status: None (Preliminary result)   Collection Time: 01/20/22 12:23 PM   Specimen: BLOOD  Result Value Ref Range Status   Specimen Description BLOOD LEFT ANTECUBITAL  Final   Special Requests   Final    BOTTLES DRAWN AEROBIC ONLY Blood Culture adequate volume   Culture   Final    NO GROWTH 3 DAYS Performed at [facility], [address] [place], [place] [address]    Report Status PENDING  Incomplete  Blood Culture (routine x 2)     Status: None (Preliminary result)   Collection Time: 01/20/22 12:40 PM   Specimen: BLOOD  Result Value Ref Range Status   Specimen Description BLOOD BLOOD LEFT HAND  Final   Special Requests   Final    BOTTLES DRAWN AEROBIC ONLY Blood Culture adequate volume   Culture   Final    NO GROWTH 3 DAYS Performed at [facility], [address] [place], [place] [address]    Report Status PENDING  Incomplete  Resp Panel by RT-PCR (Flu A&B, Covid) Anterior Nasal Swab     Status: None   Collection Time: 01/20/22 12:45 PM   Specimen: Anterior Nasal Swab  Result Value Ref Range Status   SARS Coronavirus 2 by RT PCR NEGATIVE NEGATIVE Final    Comment: (NOTE) SARS-CoV-2 target nucleic acids are NOT  DETECTED.  The SARS-CoV-2 RNA is generally detectable in upper respiratory specimens during the acute phase of infection. The lowest concentration of SARS-CoV-2 viral copies this assay can detect is 138 copies/mL. A negative result does not preclude SARS-Cov-2 infection and should not be used as the sole basis for treatment or other patient management decisions. A negative result may occur with  improper specimen collection/handling, submission of specimen other than nasopharyngeal swab, presence of viral mutation(s) within the areas targeted by this assay, and inadequate number of viral copies(<138 copies/mL). A negative result must be combined with clinical observations, patient history, and epidemiological information. The expected result is Negative.  Fact Sheet for Patients:  EntrepreneurPulse.com.au  Fact Sheet for Healthcare Providers:  IncredibleEmployment.be  This test is no t yet approved or cleared by the [place] FDA and  has been authorized for detection and/or diagnosis of SARS-CoV-2 by FDA under an Emergency Use Authorization (EUA). This EUA will remain  in effect (meaning this test can be used) for the duration of the COVID-19 declaration under Section 564(b)(1) of the Act, 21 U.S.C.section 360bbb-3(b)(1), unless the authorization is terminated  or revoked sooner.       Influenza A by PCR NEGATIVE NEGATIVE Final   Influenza B by PCR NEGATIVE NEGATIVE Final    Comment: (NOTE) The Xpert Xpress SARS-CoV-2/FLU/RSV plus assay is intended as an aid in the diagnosis of influenza from Nasopharyngeal swab specimens and should not be used as a sole basis for treatment. Nasal washings and aspirates are unacceptable for Xpert Xpress SARS-CoV-2/FLU/RSV testing.  Fact  Sheet for Patients: EntrepreneurPulse.com.au  Fact Sheet for Healthcare Providers: IncredibleEmployment.be  This test is not yet  approved or cleared by the [place] FDA and has been authorized for detection and/or diagnosis of SARS-CoV-2 by FDA under an Emergency Use Authorization (EUA). This EUA will remain in effect (meaning this test can be used) for the duration of the COVID-19 declaration under Section 564(b)(1) of the Act, 21 U.S.C. section 360bbb-3(b)(1), unless the authorization is terminated or revoked.  Performed at [facility], [address] [place], [place] [address]   MRSA Next Gen by PCR, Nasal     Status: None   Collection Time: 01/22/22  7:14 AM   Specimen: Nasal Mucosa; Nasal Swab  Result Value Ref Range Status   MRSA by PCR Next Gen NOT DETECTED NOT DETECTED Final    Comment: (NOTE) The GeneXpert MRSA Assay (FDA approved for NASAL specimens only), is one component of a comprehensive MRSA colonization surveillance program. It is not intended to diagnose MRSA infection nor to guide or monitor treatment for MRSA infections. Test performance is not FDA approved in patients less than 46 years old. Performed at [facility], [address], [place], [place] [address]          Radiology Studies: MR BRAIN WO CONTRAST  Result Date: 01/22/2022 CLINICAL DATA:  Altered mental status. EXAM: MRI HEAD WITHOUT CONTRAST TECHNIQUE: Multiplanar, multiecho pulse sequences of the brain and surrounding structures were obtained without intravenous contrast. COMPARISON:  CT head 01/21/2019 FINDINGS: Markedly limited exam due to patient motion and noncompliance secondary to changes in mental status. At the time of interpretation only the axial diffusion and T2 weighted, sagittal T1 weighted, and coronal diffusion weighted sequences were available. The axial T2 weighted and sagittal T1 weighted sequences are markedly limited due to motion artifact. Within these limitations - Brain: No acute infarction, hemorrhage, hydrocephalus, extra-axial collection. Vascular: Nondiagnostic. Skull and upper cervical spine:  Nondiagnostic. Sinuses/Orbits: Right maxillary sinus muscoal thickening. Other: None. IMPRESSION: Essentially nondiagnostic exam. No definite abnormality identified. Recommend repeat exam when the patient is able to tolerate an MRI. Otherwise a CT brain could be obtained if there is clinical concern for an acute abnormality. Electronically Signed   By: Marin Roberts M.D.   On: 01/22/2022 12:40        Scheduled Meds:  amLODipine  10 mg Oral Daily   cloNIDine  0.3 mg Transdermal Weekly   enoxaparin (LOVENOX) injection  40 mg Subcutaneous Q24H   labetalol  200 mg Oral BID   Continuous Infusions:  ceFEPime (MAXIPIME) IV 2 g (01/23/22 1124)   lactated ringers     metronidazole 500 mg (01/23/22 1007)     LOS: 3 days    Time spent: 35 minutes    Dung Prien Darleen Crocker, DO [facility]  If 7PM-7AM, please contact night-coverage [email or web address] 01/23/2022, 2:16 PM

## 2022-01-23 NOTE — Evaluation (Signed)
Occupational Therapy Evaluation Patient Details Name: Adriana Stevens MRN: [id number] DOB: Apr 03, 1926 Today's Date: 01/23/2022   History of Present Illness Adriana Stevens is a 88 y.o. female with medical history significant for HTN, Dementia, blind.  [facility] of altered mental status, confusion, and reports that patient's urine had a strong smell, symptoms have been ongoing over the past 5 days.  Reportedly patient was somnolent to obtunded. Also with vomiting and poor oral intake.  Per ED provider, patient is blind, and does not talk.  At the time of my evaluation, patient is awake, she says 1 word answers yes to all my questions, but unable to give further details to answers. (per MD)   Clinical Impression   Pt agreeable to OT and PT co-evaluation. Family present and helping with pt information and encouraging participation from pt. Pt required max A for bed mobility today. Pt typically is able to transfer to a w/c without assist but was unable to tolerate an attempt today. Pt is generally weak and limited in B UE shoulder A/ROM. Pt needs much assist at this time and is recommended for SNF for rehab to improve function prior to return to ALF.  Pt will benefit from continued OT in the hospital and recommended venue below to increase strength, balance, and endurance for safe ADL's.        Recommendations for follow up therapy are one component of a multi-disciplinary discharge planning process, led by the attending physician.  Recommendations may be updated based on patient status, additional functional criteria and insurance authorization.   Follow Up Recommendations  Skilled nursing-short term rehab (<3 hours/day)    Assistance Recommended at Discharge Frequent or constant Supervision/Assistance  Patient can return home with the following A lot of help with walking and/or transfers;A lot of help with bathing/dressing/bathroom;Assistance with  cooking/housework;Assist for transportation;Direct supervision/assist for medications management    Functional Status Assessment  Patient has had a recent decline in their functional status and demonstrates the ability to make significant improvements in function in a reasonable and predictable amount of time.  Equipment Recommendations  None recommended by OT    Recommendations for Other Services       Precautions / Restrictions Precautions Precautions: Fall Restrictions Weight Bearing Restrictions: No      Mobility Bed Mobility Overal bed mobility: Needs Assistance Bed Mobility: Supine to Sit     Supine to sit: Max assist     General bed mobility comments: Much assist to move B LE to EOB and to pull to sit.    Transfers                   General transfer comment: Pt reluctant to attempt today.      Balance Overall balance assessment: Needs assistance Sitting-balance support: Bilateral upper extremity supported, Feet supported Sitting balance-Leahy Scale: Fair Sitting balance - Comments: seated EOB                                   ADL either performed or assessed with clinical judgement   ADL Overall ADL's : Needs assistance/impaired Eating/Feeding: Set up;Minimal assistance;Sitting   Grooming: Moderate assistance;Sitting;Maximal assistance   Upper Body Bathing: Minimal assistance;Moderate assistance;Sitting   Lower Body Bathing: Maximal assistance   Upper Body Dressing : Minimal assistance;Moderate assistance;Sitting   Lower Body Dressing: Maximal assistance;Sitting/lateral leans   Toilet Transfer: Maximal  assistance;Stand-pivot Armed forces technical officer Details (indicate cue type and reason): Likely max; unable to trial today.                 Vision Baseline Vision/History: 4 Cataracts;2 Legally blind Ability to See in Adequate Light: 3 Highly impaired Patient Visual Report: No change from baseline Additional Comments: Pt highly  impaired at baseline.                Pertinent Vitals/Pain Pain Assessment Pain Assessment: Faces Faces Pain Scale: No hurt     Hand Dominance Right   Extremity/Trunk Assessment Upper Extremity Assessment Upper Extremity Assessment: RUE deficits/detail;LUE deficits/detail RUE Deficits / Details: 2+/5 shoulder flexion MMT. Generally weak. LUE Deficits / Details: 2+/5 shoulder flexion MMT. Generally weak.   Lower Extremity Assessment Lower Extremity Assessment: Defer to PT evaluation       Communication Communication Communication: Expressive difficulties   Cognition Arousal/Alertness: Awake/alert Behavior During Therapy: WFL for tasks assessed/performed Overall Cognitive Status: History of cognitive impairments - at baseline                                                        [place] expects to be discharged to:: Assisted living                             Home Equipment: Wheelchair - manual          Prior Functioning/Environment Prior Level of Function : Needs assist       Physical Assist : Mobility (physical);ADLs (physical) Mobility (physical): Transfers;Gait ADLs (physical): Bathing;Dressing;IADLs Mobility Comments: Pt transfers to w/c without assist and self-propels w/c for mobility. ADLs Comments: Pt is independent for toileting and feeding. Assisted for dressing, grooming, and dressing.        OT Problem List: Decreased strength;Decreased range of motion;Decreased activity tolerance;Impaired balance (sitting and/or standing);Impaired vision/perception      OT Treatment/Interventions: Self-care/ADL training;Therapeutic exercise;Therapeutic activities;Visual/perceptual remediation/compensation;Patient/family education;Balance training    OT Goals(Current goals can be found in the care plan section) Acute Rehab OT Goals Patient Stated Goal: Open to rehab. OT Goal Formulation: With  patient/family Time For Goal Achievement: 02/06/22 Potential to Achieve Goals: Fair  OT Frequency: Min 2X/week    Co-evaluation PT/OT/SLP Co-Evaluation/Treatment: Yes Reason for Co-Treatment: To address functional/ADL transfers   OT goals addressed during session: ADL's and self-care                       End of Session    Activity Tolerance: Patient tolerated treatment well Patient left: in bed;with call bell/phone within reach;with family/visitor present;Other (comment) (nusing students and daughter in the room.)  OT Visit Diagnosis: Unsteadiness on feet (R26.81);Other abnormalities of gait and mobility (R26.89);Muscle weakness (generalized) (M62.81);Repeated falls (R29.6);History of falling (Z91.81)                Time: 8338-2505 OT Time Calculation (min): 16 min Charges:  OT General Charges $OT Visit: 1 Visit OT Evaluation $OT Eval Low Complexity: 1 Low  Amarian Botero OT, MOT  Larey Seat 01/23/2022, 10:43 AM

## 2022-01-23 NOTE — Evaluation (Signed)
Physical Therapy Evaluation Patient Details Name: Adriana Stevens MRN: [id number] DOB: 03/16/1926 Today's Date: 01/23/2022  History of Present Illness  Adriana Stevens is a 88 y.o. female with medical history significant for HTN, Dementia, blind.  [facility] mental status, confusion, and reports that patient's urine had a strong smell, symptoms have been ongoing over the past 5 days.  Reportedly patient was somnolent to obtunded. Also with vomiting and poor oral intake.  Per ED provider, patient is blind, and does not talk.  At the time of my evaluation, patient is awake, she says 1 word answers yes to all my questions, but unable to give further details to answers.   Clinical Impression  Patient requires repeated verbal/tactile cueing to participate with therapy, demonstrates slow labored movement for sitting up at bedside with most difficulty moving legs due to stiffness, once seated frequent falling backwards, but later able to keep trunk in mid line with fair sitting balance.  Patient unable to stand due to BLE weakness, limited bilateral knee flexion and tolerated sitting up at bedside to finish breakfast with her daughter assisting after therapy.  Patient will benefit from continued skilled physical therapy in hospital and recommended venue below to increase strength, balance, endurance for safe ADLs and gait.      Recommendations for follow up therapy are one component of a multi-disciplinary discharge planning process, led by the attending physician.  Recommendations may be updated based on patient status, additional functional criteria and insurance authorization.  Follow Up Recommendations Skilled nursing-short term rehab (<3 hours/day) Can patient physically be transported by private vehicle: No    Assistance Recommended at Discharge Intermittent Supervision/Assistance  Patient can return home with the following  A lot of help  with bathing/dressing/bathroom;A lot of help with walking and/or transfers;Help with stairs or ramp for entrance;Assistance with cooking/housework    Equipment Recommendations None recommended by PT  Recommendations for Other Services       Functional Status Assessment Patient has had a recent decline in their functional status and demonstrates the ability to make significant improvements in function in a reasonable and predictable amount of time.     Precautions / Restrictions Precautions Precautions: Fall Restrictions Weight Bearing Restrictions: No      Mobility  Bed Mobility Overal bed mobility: Needs Assistance Bed Mobility: Supine to Sit     Supine to sit: Max assist     General bed mobility comments: slow labored movement requiring repeated verbal/tactile to participate with therapy    Transfers                        Ambulation/Gait                  Stairs            Wheelchair Mobility    Modified Rankin (Stroke Patients Only)       Balance Overall balance assessment: Needs assistance Sitting-balance support: Feet supported, No upper extremity supported Sitting balance-Leahy Scale: Fair Sitting balance - Comments: seated EOB                                     Pertinent Vitals/Pain Pain Assessment Pain Assessment: Faces Faces Pain Scale: No hurt    Home Living Family/patient expects to be discharged to:: Assisted living  Home Equipment: Wheelchair - manual      Prior Function Prior Level of Function : Needs assist       Physical Assist : Mobility (physical);ADLs (physical) Mobility (physical): Bed mobility;Transfers;Gait   Mobility Comments: Pt transfers to w/c without assist and self-propels w/c for mobility. ADLs Comments: Pt is independent for toileting and feeding. Assisted for dressing, grooming, and dressing.     Hand Dominance   Dominant Hand: Right     Extremity/Trunk Assessment   Upper Extremity Assessment Upper Extremity Assessment: Defer to OT evaluation    Lower Extremity Assessment Lower Extremity Assessment: Generalized weakness       Communication   Communication: Expressive difficulties  Cognition Arousal/Alertness: Awake/alert Behavior During Therapy: WFL for tasks assessed/performed Overall Cognitive Status: History of cognitive impairments - at baseline                                          General Comments      Exercises     Assessment/Plan    PT Assessment Patient needs continued PT services  PT Problem List Decreased strength;Decreased activity tolerance;Decreased balance;Decreased mobility       PT Treatment Interventions DME instruction;Functional mobility training;Therapeutic activities;Therapeutic exercise;Patient/family education;Balance training;Wheelchair mobility training    PT Goals (Current goals can be found in the Care Plan section)  Acute Rehab PT Goals Patient Stated Goal: return home PT Goal Formulation: With patient/family Time For Goal Achievement: 02/06/22 Potential to Achieve Goals: Good    Frequency Min 3X/week     Co-evaluation PT/OT/SLP Co-Evaluation/Treatment: Yes Reason for Co-Treatment: To address functional/ADL transfers;Other (comment) PT goals addressed during session: Mobility/safety with mobility;Balance;Proper use of DME         AM-PAC PT "6 Clicks" Mobility  Outcome Measure Help needed turning from your back to your side while in a flat bed without using bedrails?: A Lot Help needed moving from lying on your back to sitting on the side of a flat bed without using bedrails?: A Lot Help needed moving to and from a bed to a chair (including a wheelchair)?: Total Help needed standing up from a chair using your arms (e.g., wheelchair or bedside chair)?: Total Help needed to walk in hospital room?: Total Help needed climbing 3-5 steps with a  railing? : Total 6 Click Score: 8    End of Session   Activity Tolerance: Patient tolerated treatment well;Patient limited by fatigue Patient left: in bed;with call bell/phone within reach;with family/visitor present Nurse Communication: Mobility status PT Visit Diagnosis: Unsteadiness on feet (R26.81);Other abnormalities of gait and mobility (R26.89);Muscle weakness (generalized) (M62.81)    Time: 6283-6629 PT Time Calculation (min) (ACUTE ONLY): 20 min   Charges:   PT Evaluation $PT Eval Moderate Complexity: 1 Mod PT Treatments $Therapeutic Activity: 8-22 mins        2:02 PM, 01/23/22 Lonell Grandchild, MPT Physical Therapist with [facility] 336 [phone] office [phone] mobile phone

## 2022-01-23 NOTE — TOC Progression Note (Addendum)
Transition of Care [facility]) - Progression Note    Patient Details  Name: Adriana Stevens MRN: [id number] Date of Birth: [birth year]  Transition [facility]) CM/SW Contact  Boneta Lucks, RN Phone Number: 01/23/2022, 1:03 PM  Clinical Narrative: PT is recommending SNF. Per [name], her mother did not want to participate. Patient is active with  [facility], she is calling to see if she can come back with palliative. Palliative consulted to discuss hospice. Sent a message to D. Curry with [facility] to assess safe discharge plan as well. TOC to follow.   Addendum :starting INS AUTH for [employer]. TOC to follow   Expected Discharge Plan: Assisted Living Barriers to Discharge: Continued Medical Work up  Expected Discharge Plan and Services Expected Discharge Plan: Assisted Living In-house Referral: Clinical Social Work Discharge Planning Services: CM Consult Post Acute Care Choice: Resumption of Svcs/PTA Provider Living arrangements for the past 2 months: [place]                    Readmission Risk Interventions     No data to display

## 2022-01-23 NOTE — Plan of Care (Signed)
  Problem: Acute Rehab OT Goals (only OT should resolve) Goal: Pt. Will Perform Grooming Flowsheets (Taken 01/23/2022 1053) Pt Will Perform Grooming:  with min guard assist  sitting Goal: Pt. Will Perform Upper Body Dressing Flowsheets (Taken 01/23/2022 1053) Pt Will Perform Upper Body Dressing:  sitting  with min guard assist Goal: Pt. Will Transfer To Toilet Flowsheets (Taken 01/23/2022 1053) Pt Will Transfer to Toilet:  stand pivot transfer  with min guard assist Goal: Pt. Will Perform Toileting-Clothing Manipulation Flowsheets (Taken 01/23/2022 1053) Pt Will Perform Toileting - Clothing Manipulation and hygiene:  with min assist  sitting/lateral leans Goal: Pt/Caregiver Will Perform Home Exercise Program Flowsheets (Taken 01/23/2022 1053) Pt/caregiver will Perform Home Exercise Program:  Increased ROM  Increased strength  Both right and left upper extremity  Independently  Eman Morimoto OT, MOT

## 2022-01-23 NOTE — Plan of Care (Signed)
  Problem: Acute Rehab PT Goals(only PT should resolve) Goal: Pt Will Go Supine/Side To Sit Outcome: Progressing Flowsheets (Taken 01/23/2022 1406) Pt will go Supine/Side to Sit: with min guard assist Goal: Patient Will Transfer Sit To/From Stand Outcome: Progressing Flowsheets (Taken 01/23/2022 1406) Patient will transfer sit to/from stand: with minimal assist Goal: Pt Will Transfer Bed To Chair/Chair To Bed Outcome: Progressing Flowsheets (Taken 01/23/2022 1406) Pt will Transfer Bed to Chair/Chair to Bed: with min assist   2:07 PM, 01/23/22 Lonell Grandchild, MPT Physical Therapist with [facility] [phone] office [phone] mobile phone

## 2022-01-23 NOTE — NC FL2 (Signed)
[place] MEDICAID FL2 LEVEL OF CARE SCREENING TOOL     IDENTIFICATION  Patient Name: Adriana Stevens Birthdate: 08-25-1925 Sex: female Admission Date (Current Location): 01/20/2022  [facility] and [place] Number:  [employer] and Address:  [place] [address] [place]      Provider Number: [id number]  Attending Physician Name and Address:  Rodena Goldmann, DO  Relative Name and Phone Number:  [facility] (Daughter)   [phone]    Current Level of Care: Hospital Recommended Level of Care: [place] Prior Approval Number:    Date Approved/Denied:   PASRR Number: [id number]  Discharge Plan: Other (Comment) ([place])    Current Diagnoses: Patient Active Problem List   Diagnosis Date Noted   Poor dentition 01/21/2022   Sepsis ([place]) 01/21/2022   Hypokalemia 01/21/2022   SIRS (systemic inflammatory response syndrome) ([place]) 67/04/4579   Acute metabolic encephalopathy 99/83/3825   Abnormal gait 12/27/2020   Insomnia 12/27/2020   Mixed hyperlipidemia 12/27/2020   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   Blindness of right eye 12/27/2020   Breast cancer, right ([place]) 08/01/2020   Acute ischemic heart disease ([place]) 10/16/2015   Essential hypertension 10/16/2015   Dyslipidemia 10/16/2015   Primary generalized (osteo)arthritis 10/16/2015   TIA (transient ischemic attack) 10/16/2015   Thrombocytopenia ([place]) 10/16/2015    Orientation RESPIRATION BLADDER Height & Weight     Self  Normal Continent Weight: 74.8 kg Height:  '5\' 5"'$  (165.1 cm)  BEHAVIORAL SYMPTOMS/MOOD NEUROLOGICAL BOWEL NUTRITION STATUS      Continent Diet (regular)  AMBULATORY STATUS COMMUNICATION OF NEEDS Skin   Extensive Assist Verbally Normal                       Personal Care Assistance Level of Assistance  Bathing, Feeding, Dressing Bathing Assistance: Limited assistance Feeding assistance: Limited  assistance Dressing Assistance: Limited assistance     Functional Limitations Info  Sight, Hearing, Speech Sight Info: Impaired Hearing Info: Adequate Speech Info: Adequate    SPECIAL CARE FACTORS FREQUENCY  PT (By licensed PT)     PT Frequency: 5 times a week              Contractures Contractures Info: Not present    Additional Factors Info  Code Status, Allergies Code Status Info: DNR Allergies Info: NKA           Current Medications (01/23/2022):  This is the current hospital active medication list Current Facility-Administered Medications  Medication Dose Route Frequency Provider Last Rate Last Admin   acetaminophen (TYLENOL) tablet 650 mg  650 mg Oral Q6H PRN Emokpae, Ejiroghene E, MD       Or   acetaminophen (TYLENOL) suppository 650 mg  650 mg Rectal Q6H PRN Emokpae, Ejiroghene E, MD       amLODipine (NORVASC) tablet 10 mg  10 mg Oral Daily Emokpae, Ejiroghene E, MD   10 mg at 01/23/22 1014   ceFEPIme (MAXIPIME) 2 g in sodium chloride 0.9 % 100 mL IVPB  2 g Intravenous Q12H Shahmehdi, Seyed A, MD 200 mL/hr at 01/23/22 1124 2 g at 01/23/22 1124   cloNIDine (CATAPRES - Dosed in mg/24 hr) patch 0.3 mg  0.3 mg Transdermal Weekly Shahmehdi, Seyed A, MD   0.3 mg at 01/22/22 0841   enoxaparin (LOVENOX) injection 40 mg  40 mg Subcutaneous Q24H Shahmehdi, Seyed A, MD   40 mg at 01/22/22 2209   haloperidol (HALDOL) tablet  2 mg  2 mg Oral Q6H PRN Shahmehdi, Seyed A, MD       Or   haloperidol lactate (HALDOL) injection 2 mg  2 mg Intramuscular Q6H PRN Skipper Cliche A, MD   2 mg at 01/21/22 1534   labetalol (NORMODYNE) injection 10 mg  10 mg Intravenous Q4H PRN Emokpae, Ejiroghene E, MD   10 mg at 01/20/22 1714   labetalol (NORMODYNE) tablet 200 mg  200 mg Oral BID Shahmehdi, Seyed A, MD   200 mg at 01/23/22 1011   metroNIDAZOLE (FLAGYL) IVPB 500 mg  500 mg Intravenous Q12H Emokpae, Ejiroghene E, MD 100 mL/hr at 01/23/22 1007 500 mg at 01/23/22 1007   ondansetron (ZOFRAN)  tablet 4 mg  4 mg Oral Q6H PRN Emokpae, Ejiroghene E, MD       Or   ondansetron (ZOFRAN) injection 4 mg  4 mg Intravenous Q6H PRN Emokpae, Ejiroghene E, MD       polyethylene glycol (MIRALAX / GLYCOLAX) packet 17 g  17 g Oral Daily PRN Emokpae, Ejiroghene E, MD         Discharge Medications: Please see discharge summary for a list of discharge medications.  Relevant Imaging Results:  Relevant Lab Results:   Additional Information SSN: [id number]  Boneta Lucks, RN

## 2022-01-24 DIAGNOSIS — G9340 Encephalopathy, unspecified: Secondary | ICD-10-CM | POA: Diagnosis not present

## 2022-01-24 DIAGNOSIS — R6520 Severe sepsis without septic shock: Secondary | ICD-10-CM | POA: Diagnosis not present

## 2022-01-24 DIAGNOSIS — A419 Sepsis, unspecified organism: Secondary | ICD-10-CM | POA: Diagnosis not present

## 2022-01-24 LAB — BASIC METABOLIC PANEL
Anion gap: 5 (ref 5–15)
BUN: 23 mg/dL (ref 8–23)
CO2: 23 mmol/L (ref 22–32)
Calcium: 8.2 mg/dL — ABNORMAL LOW (ref 8.9–10.3)
Chloride: 113 mmol/L — ABNORMAL HIGH (ref 98–111)
Creatinine, Ser: 0.97 mg/dL (ref 0.44–1.00)
GFR, Estimated: 54 mL/min — ABNORMAL LOW (ref >60)
Glucose, Bld: 107 mg/dL — ABNORMAL HIGH (ref 70–99)
Potassium: 3.2 mmol/L — ABNORMAL LOW (ref 3.5–5.1)
Sodium: 141 mmol/L (ref 135–145)

## 2022-01-24 LAB — CBC
HCT: 31.3 % — ABNORMAL LOW (ref 36.0–46.0)
HCT: 33.4 % — ABNORMAL LOW (ref 36.0–46.0)
Hemoglobin: 10.1 g/dL — ABNORMAL LOW (ref 12.0–15.0)
Hemoglobin: 10.6 g/dL — ABNORMAL LOW (ref 12.0–15.0)
MCH: 29.9 pg (ref 26.0–34.0)
MCH: 29.8 pg (ref 26.0–34.0)
MCHC: 32.3 g/dL (ref 30.0–36.0)
MCHC: 31.7 g/dL (ref 30.0–36.0)
MCV: 92.6 fL (ref 80.0–100.0)
MCV: 93.8 fL (ref 80.0–100.0)
Platelets: 166 10*3/uL (ref 150–400)
Platelets: 175 10*3/uL (ref 150–400)
RBC: 3.38 MIL/uL — ABNORMAL LOW (ref 3.87–5.11)
RBC: 3.56 MIL/uL — ABNORMAL LOW (ref 3.87–5.11)
RDW: 13.4 % (ref 11.5–15.5)
RDW: 13.4 % (ref 11.5–15.5)
WBC: 9 10*3/uL (ref 4.0–10.5)
WBC: 9.6 10*3/uL (ref 4.0–10.5)
nRBC: 0 % (ref 0.0–0.2)
nRBC: 0 % (ref 0.0–0.2)

## 2022-01-24 LAB — MAGNESIUM: Magnesium: 1.5 mg/dL — ABNORMAL LOW (ref 1.7–2.4)

## 2022-01-24 MED ORDER — MAGNESIUM SULFATE 2 GM/50ML IV SOLN
2.0000 g | Freq: Once | INTRAVENOUS | Status: AC
  Administered 2022-01-24: 2 g via INTRAVENOUS
  Filled 2022-01-24: qty 50

## 2022-01-24 MED ORDER — AMOXICILLIN 250 MG/5ML PO SUSR: 500.0000 mg | Freq: Three times a day (TID) | ORAL | 0 refills | Status: AC

## 2022-01-24 MED ORDER — LABETALOL HCL 200 MG PO TABS: 200.0000 mg | ORAL_TABLET | Freq: Two times a day (BID) | ORAL | 0 refills | Status: AC

## 2022-01-24 MED ORDER — POTASSIUM CHLORIDE CRYS ER 20 MEQ PO TBCR
40.0000 meq | EXTENDED_RELEASE_TABLET | Freq: Once | ORAL | Status: AC
  Administered 2022-01-24: 40 meq via ORAL
  Filled 2022-01-24: qty 2

## 2022-01-24 MED ORDER — LABETALOL HCL 200 MG PO TABS: 200.0000 mg | ORAL_TABLET | Freq: Two times a day (BID) | ORAL | 0 refills | Status: DC

## 2022-01-24 MED ORDER — CLONIDINE 0.3 MG/24HR TD PTWK: 0.3000 mg | MEDICATED_PATCH | TRANSDERMAL | 12 refills | Status: DC

## 2022-01-24 MED ORDER — AMOXICILLIN 250 MG/5ML PO SUSR: 500.0000 mg | Freq: Three times a day (TID) | ORAL | 0 refills | Status: DC

## 2022-01-24 NOTE — Discharge Summary (Signed)
Physician Discharge Summary  NADIYAH ZEIS TDV:[id number] DOB: 1925/07/12 DOA: 01/20/2022  PCP: Vidal Schwalbe, MD  Admit date: 01/20/2022  Discharge date: 01/24/2022  Admitted From:ALF  Disposition: SNF  Recommendations for Outpatient Follow-up:  Follow up with PCP in 1-2 weeks Follow-up with Dr. Merlene Laughter in approximately 1 month with information provided to assess for dementia Continue on amoxicillin to complete course of treatment Continue other medications as noted below including hypertension medicines and monitor BP in outpatient setting Outpatient palliative  Home Health: None  Equipment/Devices: None  Discharge Condition:Stable  CODE STATUS: DNR  Diet recommendation: Dysphagia 1  Brief/Interim Summary: Adriana Stevens is a 88 y.o. female with medical history significant for HTN, Dementia, blind. [facility] of altered mental status, confusion, and reports that patient's urine had a strong smell, symptoms have been ongoing over the past 5 days.  Reportedly patient was somnolent to obtunded. Also with vomiting and poor oral intake.  Patient was admitted with sepsis, present on admission with associated toxic, metabolic encephalopathy in the setting of dementia.  She did not have an acute source of infection noted and was maintained on initial IV antibiotics which was then transitioned to oral amoxicillin which she is now tolerating.  She was noncompliant with her home medications including blood pressure agents and did have some elevated blood pressure readings requiring increased dosing of antihypertensives which have been provided.  She is now in stable condition for discharge to SNF per PT recommendations and will need outpatient follow-up with neurology to assess for dementia as well.  Recommendations for outpatient palliative care as well due to advanced age and dementia.  Discharge Diagnoses:  Principal Problem:   Sepsis  ([address]) Active Problems:   Poor dentition   Hypokalemia   SIRS (systemic inflammatory response syndrome) (HCC)   Essential hypertension   Blindness of right eye   Acute metabolic encephalopathy  Principal discharge diagnosis: Acute metabolic encephalopathy in the setting of dementia secondary to sepsis present on admission.  Discharge Instructions  Discharge Instructions     Diet - low sodium heart healthy   Complete by: As directed    Increase activity slowly   Complete by: As directed       Allergies as of 01/24/2022   No Known Allergies      Medication List     TAKE these medications    alum & mag hydroxide-simeth 200-200-20 MG/5ML suspension Commonly known as: MAALOX/MYLANTA Take 30 mLs by mouth every 6 (six) hours as needed for indigestion or heartburn.   amLODipine 10 MG tablet Commonly known as: NORVASC Take 10 mg by mouth daily.   amoxicillin 250 MG/5ML suspension Commonly known as: AMOXIL Take 10 mLs (500 mg total) by mouth every 8 (eight) hours for 3 days.   anastrozole 1 MG tablet Commonly known as: ARIMIDEX Take 1 tablet (1 mg total) by mouth daily.   Ascorbic Acid 500 MG/15ML Liqd 1 tablet   Aspercreme Lidocaine 4 % Generic drug: lidocaine Apply 1 patch topically daily.   cloNIDine 0.3 mg/24hr patch Commonly known as: CATAPRES - Dosed in mg/24 hr Place 1 patch (0.3 mg total) onto the skin once a week. Start taking on: January 29, 2022   diclofenac Sodium 1 % Gel Commonly known as: VOLTAREN See admin instructions.   docusate sodium 100 MG capsule Commonly known as: COLACE Take 100 mg by mouth daily.   famotidine 20 MG tablet Commonly known as: PEPCID Take 20  mg by mouth 2 (two) times daily.   feeding supplement (PRO-STAT 64) Liqd Take 30 mLs by mouth daily.   gabapentin 100 MG capsule Commonly known as: NEURONTIN Take 100 mg by mouth at bedtime.   hydroxypropyl methylcellulose / hypromellose 2.5 % ophthalmic solution Commonly  known as: ISOPTO TEARS / GONIOVISC 1 drop 4 (four) times daily.   labetalol 200 MG tablet Commonly known as: NORMODYNE Take 1 tablet (200 mg total) by mouth 2 (two) times daily.   latanoprost 0.005 % ophthalmic solution Commonly known as: XALATAN SMARTSIG:In Eye(s)   loteprednol 0.5 % ophthalmic suspension Commonly known as: LOTEMAX SMARTSIG:In Eye(s)   Magnesium 400 MG Tabs Take 1 tablet by mouth at bedtime.   magnesium hydroxide 400 MG/5ML suspension Commonly known as: MILK OF MAGNESIA Take 30 mLs by mouth daily as needed for mild constipation.   melatonin 3 MG Tabs tablet Take 3 mg by mouth at bedtime.   nitroGLYCERIN 0.4 MG SL tablet Commonly known as: NITROSTAT Place 0.4 mg under the tongue every 5 (five) minutes as needed for chest pain.   olmesartan-hydrochlorothiazide 40-12.5 MG tablet Commonly known as: BENICAR HCT Take 1 tablet by mouth daily.   potassium chloride SA 20 MEQ tablet Commonly known as: KLOR-CON M Take 20 mEq by mouth daily.   senna 8.6 MG Tabs tablet Commonly known as: SENOKOT Take 2 tablets by mouth at bedtime.   sodium chloride 5 % ophthalmic solution Commonly known as: MURO 128 SMARTSIG:In Eye(s)   Systane 0.4-0.3 % Soln Generic drug: Polyethyl Glycol-Propyl Glycol Apply to eye.   Tab-A-Vite Tabs Take 1 tablet by mouth daily.   traZODone 50 MG tablet Commonly known as: DESYREL Take 50 mg by mouth at bedtime as needed for sleep.        Follow-up Information     Vidal Schwalbe, MD. Schedule an appointment as soon as possible for a visit in 1 week(s).   Specialty: Family Medicine Contact information: [address] [place] [address] [phone]         Phillips Odor, MD. Schedule an appointment as soon as possible for a visit in 1 month(s).   Specialty: Neurology Contact information: [address] [place] [address] [phone]                No Known Allergies  Consultations: Palliative  care   Procedures/Studies: MR BRAIN WO CONTRAST  Result Date: 01/22/2022 CLINICAL DATA:  Altered mental status. EXAM: MRI HEAD WITHOUT CONTRAST TECHNIQUE: Multiplanar, multiecho pulse sequences of the brain and surrounding structures were obtained without intravenous contrast. COMPARISON:  CT head 01/21/2019 FINDINGS: Markedly limited exam due to patient motion and noncompliance secondary to changes in mental status. At the time of interpretation only the axial diffusion and T2 weighted, sagittal T1 weighted, and coronal diffusion weighted sequences were available. The axial T2 weighted and sagittal T1 weighted sequences are markedly limited due to motion artifact. Within these limitations - Brain: No acute infarction, hemorrhage, hydrocephalus, extra-axial collection. Vascular: Nondiagnostic. Skull and upper cervical spine: Nondiagnostic. Sinuses/Orbits: Right maxillary sinus muscoal thickening. Other: None. IMPRESSION: Essentially nondiagnostic exam. No definite abnormality identified. Recommend repeat exam when the patient is able to tolerate an MRI. Otherwise a CT brain could be obtained if there is clinical concern for an acute abnormality. Electronically Signed   By: Marin Roberts M.D.   On: 01/22/2022 12:40   CT MAXILLOFACIAL W CONTRAST  Result Date: 01/21/2022 CLINICAL DATA:  Possible source of infection, poor dentition EXAM: CT  MAXILLOFACIAL WITH CONTRAST TECHNIQUE: Multidetector CT imaging of the maxillofacial structures was performed with intravenous contrast. Multiplanar CT image reconstructions were also generated. RADIATION DOSE REDUCTION: This exam was performed according to the departmental dose-optimization program which includes automated exposure control, adjustment of the mA and/or kV according to patient size and/or use of iterative reconstruction technique. CONTRAST:  54m OMNIPAQUE IOHEXOL 300 MG/ML  SOLN COMPARISON:  No prior maxillofacial CT, correlation is made with 01/20/2022 CT  head FINDINGS: Evaluation is limited by motion artifact, despite multiple attempts at reimaging. Particularly affects imaging of the mandible. Osseous: No acute facial bone fracture or mandibular dislocation. No definite destructive process. No periapical lucency noted about the remaining mandibular teeth, although this is motion limited. Orbits: Negative. No traumatic or inflammatory finding. Status post bilateral lens replacements. Sinuses: Partial opacification of the right maxillary sinus with some higher density material, likely inspissated secretions. No evidence of maxillary dehiscence. The remainder of the paranasal sinuses and mastoid air cells are clear. Soft tissues: Negative. No focal collection or abnormal enhancement. Limited intracranial: No significant or unexpected finding. IMPRESSION: Evaluation is limited by motion artifact, despite multiple attempts. Within this limitation, no acute osseous abnormality or focal soft tissue collection. Electronically Signed   By: AMerilyn BabaM.D.   On: 01/21/2022 13:11   CT Angio Chest/Abd/Pel for Dissection W and/or Wo Contrast  Result Date: 01/20/2022 CLINICAL DATA:  Evaluate for aortic dissection. EXAM: CT ANGIOGRAPHY CHEST, ABDOMEN AND PELVIS TECHNIQUE: Non-contrast CT of the chest was initially obtained. Multidetector CT imaging through the chest, abdomen and pelvis was performed using the standard protocol during bolus administration of intravenous contrast. Multiplanar reconstructed images and MIPs were obtained and reviewed to evaluate the vascular anatomy. RADIATION DOSE REDUCTION: This exam was performed according to the departmental dose-optimization program which includes automated exposure control, adjustment of the mA and/or kV according to patient size and/or use of iterative reconstruction technique. CONTRAST:  833mOMNIPAQUE IOHEXOL 350 MG/ML SOLN COMPARISON:  None Available. FINDINGS: CTA CHEST FINDINGS Cardiovascular: Preferential  opacification of the thoracic aorta. No evidence of thoracic aortic aneurysm or dissection. Normal heart size. No pericardial effusion. Aortic atherosclerosis and 3 vessel coronary artery calcifications. Mediastinum/Nodes: No enlarged mediastinal, hilar, or axillary lymph nodes. Thyroid gland, trachea, and esophagus demonstrate no significant findings. Lungs/Pleura: No pleural effusion or airspace consolidation. There is subsegmental atelectasis identified involving the anterior right middle lobe, image 79/12. Scar versus subsegmental atelectasis within the anterior right lower lobe noted. Musculoskeletal: No acute or suspicious osseous findings. Spondylosis identified in the thoracic spine. Review of the MIP images confirms the above findings. CTA ABDOMEN AND PELVIS FINDINGS VASCULAR Aorta: Infra renal abdominal aortic aneurysm is identified measuring 3.8 cm in maximum AP diameter. There is eccentric mural thrombus within the aneurysmal dilated aorta measuring approximately 1.8 cm in thickness. After scratch set calcified at scratch set atherosclerotic calcifications noted throughout the aorta. No signs of dissection or significant stenosis. Celiac: Calcified plaque at the origin of the celiac artery results in approximately 50% stenosis. SMA: Patent without evidence of aneurysm, dissection, vasculitis or significant stenosis. Renals: Calcified plaque at the origin of the right renal artery results in approximately 40% stenosis. Left renal artery appears patent without significant stenosis. IMA: Patent without evidence of aneurysm, dissection, vasculitis or significant stenosis. Inflow: Patent without evidence of aneurysm, dissection, vasculitis or significant stenosis. Veins: No obvious venous abnormality within the limitations of this arterial phase study. Review of the MIP images confirms the above findings. NON-VASCULAR Hepatobiliary: No  focal liver abnormality is seen. No gallstones, gallbladder wall  thickening, or biliary dilatation. Pancreas: Unremarkable. No pancreatic ductal dilatation or surrounding inflammatory changes. Spleen: Normal in size without focal abnormality. Adrenals/Urinary Tract: Normal adrenal glands. Bosniak class 1 cyst arises off the upper pole of the right kidney measuring 4.2 cm. Bosniak class 1 cyst arising off the inferior pole of the left kidney measures 2.4 cm. Additional too small to reliably characterize low-attenuation kidney lesions are identified bilaterally compatible with Bosniak class 2 cyst. No follow-up imaging of these kidney lesions recommended. No kidney stones identified bilaterally. No suspicious mass or hydronephrosis. Urinary bladder is unremarkable. Stomach/Bowel: Indeterminate partially calcified exophytic lesion is noted along the greater curvature of the stomach with possible noncalcified in the luminal component. This measures 1.7 x 1.3 cm and is of uncertain clinical significance. The appendix is visualized and appears normal. No bowel wall thickening, inflammation, or distension. Lymphatic: No abdominopelvic adenopathy. Reproductive: Uterus and bilateral adnexa are unremarkable. Other: No free fluid or fluid collections. Musculoskeletal: Right-sided sacroiliitis is identified. Multilevel degenerative disc disease is identified throughout the lumbar spine. No acute or suspicious osseous findings. Bilateral hip osteoarthritis, right greater than left. Review of the MIP images confirms the above findings. IMPRESSION: 1. No evidence for aortic dissection. 2. 3.8 cm infrarenal abdominal aortic aneurysm. Recommend follow-up every 2 years. Reference: J Am Coll Radiol 9024;09:735-329. 3. Right-sided sacroiliitis. 4. Small, indeterminate partially calcified exophytic lesion is noted along the greater curvature of the stomach with possible noncalcified in the luminal component. This is of uncertain clinical significance, but is favored to represent a chronic  abnormality. Consider follow-up with gastroenterology. 5. Aortic Atherosclerosis (ICD10-I70.0). Electronically Signed   By: Kerby Moors M.D.   On: 01/20/2022 15:06   CT Head Wo Contrast  Result Date: 01/20/2022 CLINICAL DATA:  Delirium EXAM: CT HEAD WITHOUT CONTRAST TECHNIQUE: Contiguous axial images were obtained from the base of the skull through the vertex without intravenous contrast. RADIATION DOSE REDUCTION: This exam was performed according to the departmental dose-optimization program which includes automated exposure control, adjustment of the mA and/or kV according to patient size and/or use of iterative reconstruction technique. COMPARISON:  None Available. FINDINGS: Brain: Generalized atrophy. Moderate white matter hypodensity in the periventricular deep white matter bilaterally. Negative for acute infarct, hemorrhage, mass. Empty sella with enlargement of the sella filled with CSF. Vascular: Negative for hyperdense vessel Skull: Negative Sinuses/Orbits: Chronic fracture right maxillary sinus. Mucosal edema with calcification right maxillary sinus. Remaining sinuses clear. Bilateral cataract extraction Other: None IMPRESSION: Atrophy and chronic white matter changes.  No acute abnormality. Electronically Signed   By: Franchot Gallo M.D.   On: 01/20/2022 14:47   DG Chest Portable 1 View  Result Date: 01/20/2022 CLINICAL DATA:  Possible sepsis EXAM: PORTABLE CHEST 1 VIEW COMPARISON:  None Available. FINDINGS: Transverse diameter of heart is increased. There is aneurysmal dilation of thoracic aorta. There are no signs of pulmonary edema or focal pulmonary consolidation. Small linear densities are seen in lower lung fields. There is no significant pleural effusion or pneumothorax. Degenerative changes are noted in right shoulder. IMPRESSION: There is tortuosity and aneurysmal dilation of thoracic aorta. There are no signs of pulmonary edema or focal pulmonary consolidation. Small linear densities  in the lower lung fields suggest scarring or subsegmental atelectasis. Electronically Signed   By: Elmer Picker M.D.   On: 01/20/2022 13:13     Discharge Exam: Vitals:   01/23/22 2133 01/24/22 0600  BP: (!) 173/81 (!) 172/87  Pulse: (!) 110 (!) 101  Resp: 18 17  Temp: 98.6 F (37 C) 99 F (37.2 C)  SpO2: 96% 99%   Vitals:   01/23/22 0549 01/23/22 1349 01/23/22 2133 01/24/22 0600  BP: (!) 154/82 (!) 115/50 (!) 173/81 (!) 172/87  Pulse: 70 72 (!) 110 (!) 101  Resp: 18 (!) '22 18 17  '$ Temp: 99.2 F (37.3 C) 98.6 F (37 C) 98.6 F (37 C) 99 F (37.2 C)  TempSrc:  Oral  Oral  SpO2: 93% 98% 96% 99%  Weight:      Height:        General: Pt is alert, awake, not in acute distress, pleasantly confused Cardiovascular: RRR, S1/S2 +, no rubs, no gallops Respiratory: CTA bilaterally, no wheezing, no rhonchi Abdominal: Soft, NT, ND, bowel sounds + Extremities: no edema, no cyanosis    The results of significant diagnostics from this hospitalization (including imaging, microbiology, ancillary and laboratory) are listed below for reference.     Microbiology: Recent Results (from the past 240 hour(s))  Urine Culture     Status: None   Collection Time: 01/20/22 11:57 AM   Specimen: Urine, Catheterized  Result Value Ref Range Status   Specimen Description   Final    URINE, CATHETERIZED Performed at [facility], [address], [place], [place] [address]    Special Requests   Final    NONE Performed at [facility], [address] [place], [place] [address]    Culture   Final    NO GROWTH Performed at [facility], [place] [address] [place], [place] [address]    Report Status 01/21/2022 FINAL  Final  Blood Culture (routine x 2)     Status: None (Preliminary result)   Collection Time: 01/20/22 12:23 PM   Specimen: BLOOD  Result Value Ref Range Status   Specimen Description BLOOD LEFT ANTECUBITAL  Final   Special Requests   Final    BOTTLES DRAWN AEROBIC ONLY Blood  Culture adequate volume   Culture   Final    NO GROWTH 4 DAYS Performed at [facility], [address], [place], [place] [address]    Report Status PENDING  Incomplete  Blood Culture (routine x 2)     Status: None (Preliminary result)   Collection Time: 01/20/22 12:40 PM   Specimen: BLOOD  Result Value Ref Range Status   Specimen Description BLOOD BLOOD LEFT HAND  Final   Special Requests   Final    BOTTLES DRAWN AEROBIC ONLY Blood Culture adequate volume   Culture   Final    NO GROWTH 4 DAYS Performed at [facility], [address], [place],  [address]    Report Status PENDING  Incomplete  Resp Panel by RT-PCR (Flu A&B, Covid) Anterior Nasal Swab     Status: None   Collection Time: 01/20/22 12:45 PM   Specimen: Anterior Nasal Swab  Result Value Ref Range Status   SARS Coronavirus 2 by RT PCR NEGATIVE NEGATIVE Final    Comment: (NOTE) SARS-CoV-2 target nucleic acids are NOT DETECTED.  The SARS-CoV-2 RNA is generally detectable in upper respiratory specimens during the acute phase of infection. The lowest concentration of SARS-CoV-2 viral copies this assay can detect is 138 copies/mL. A negative result does not preclude SARS-Cov-2 infection and should not be used as the sole basis for treatment or other patient management decisions. A negative result may occur with  improper specimen collection/handling, submission of specimen other than nasopharyngeal swab, presence of  viral mutation(s) within the areas targeted by this assay, and inadequate number of viral copies(<138 copies/mL). A negative result must be combined with clinical observations, patient history, and epidemiological information. The expected result is Negative.  Fact Sheet for Patients:  EntrepreneurPulse.com.au  Fact Sheet for Healthcare Providers:  IncredibleEmployment.be  This test is no t yet approved or cleared by the [place] FDA and  has been authorized  for detection and/or diagnosis of SARS-CoV-2 by FDA under an Emergency Use Authorization (EUA). This EUA will remain  in effect (meaning this test can be used) for the duration of the COVID-19 declaration under Section 564(b)(1) of the Act, 21 U.S.C.section 360bbb-3(b)(1), unless the authorization is terminated  or revoked sooner.       Influenza A by PCR NEGATIVE NEGATIVE Final   Influenza B by PCR NEGATIVE NEGATIVE Final    Comment: (NOTE) The Xpert Xpress SARS-CoV-2/FLU/RSV plus assay is intended as an aid in the diagnosis of influenza from Nasopharyngeal swab specimens and should not be used as a sole basis for treatment. Nasal washings and aspirates are unacceptable for Xpert Xpress SARS-CoV-2/FLU/RSV testing.  Fact Sheet for Patients: EntrepreneurPulse.com.au  Fact Sheet for Healthcare Providers: IncredibleEmployment.be  This test is not yet approved or cleared by the [place] FDA and has been authorized for detection and/or diagnosis of SARS-CoV-2 by FDA under an Emergency Use Authorization (EUA). This EUA will remain in effect (meaning this test can be used) for the duration of the COVID-19 declaration under Section 564(b)(1) of the Act, 21 U.S.C. section 360bbb-3(b)(1), unless the authorization is terminated or revoked.  Performed at [facility], [address], [place], [place] [address]   MRSA Next Gen by PCR, Nasal     Status: None   Collection Time: 01/22/22  7:14 AM   Specimen: Nasal Mucosa; Nasal Swab  Result Value Ref Range Status   MRSA by PCR Next Gen NOT DETECTED NOT DETECTED Final    Comment: (NOTE) The GeneXpert MRSA Assay (FDA approved for NASAL specimens only), is one component of a comprehensive MRSA colonization surveillance program. It is not intended to diagnose MRSA infection nor to guide or monitor treatment for MRSA infections. Test performance is not FDA approved in patients less than 22  years old. Performed at [facility], [address], [place], [place] [address]      Labs: BNP (last 3 results) Recent Labs    01/20/22 1230  BNP 59.5   Basic Metabolic Panel: Recent Labs  Lab 01/20/22 1230 01/21/22 0550 01/22/22 0507 01/23/22 0434 01/24/22 0316  NA 138 135 135 139 141  K 3.7 3.1* 3.1* 3.2* 3.2*  CL 97* 97* 102 110 113*  CO2 '27 28 22 23 23  '$ GLUCOSE 152* 106* 114* 102* 107*  BUN '17 12 14 20 23  '$ CREATININE 1.24* 0.93 0.89 1.15* 0.97  CALCIUM 9.5 8.4* 8.2* 7.8* 8.2*  MG  --   --   --   --  1.5*   Liver Function Tests: Recent Labs  Lab 01/20/22 1230  AST 20  ALT 10  ALKPHOS 61  BILITOT 0.7  PROT 8.1  ALBUMIN 4.2   No results for input(s): "LIPASE", "AMYLASE" in the last 168 hours. No results for input(s): "AMMONIA" in the last 168 hours. CBC: Recent Labs  Lab 01/20/22 1230 01/21/22 0550 01/22/22 0507 01/23/22 0434 01/24/22 0316  WBC 10.3 10.0 8.9 9.0 9.6  NEUTROABS 7.8*  --   --   --   --   HGB  14.1 12.6 12.7 10.1* 10.6*  HCT 45.0 38.9 38.9 31.3* 33.4*  MCV 95.7 92.0 91.7 92.6 93.8  PLT 192 183 165 166 175   Cardiac Enzymes: No results for input(s): "CKTOTAL", "CKMB", "CKMBINDEX", "TROPONINI" in the last 168 hours. BNP: Invalid input(s): "POCBNP" CBG: No results for input(s): "GLUCAP" in the last 168 hours. D-Dimer No results for input(s): "DDIMER" in the last 72 hours. Hgb A1c No results for input(s): "HGBA1C" in the last 72 hours. Lipid Profile No results for input(s): "CHOL", "HDL", "LDLCALC", "TRIG", "CHOLHDL", "LDLDIRECT" in the last 72 hours. Thyroid function studies No results for input(s): "TSH", "T4TOTAL", "T3FREE", "THYROIDAB" in the last 72 hours.  Invalid input(s): "FREET3" Anemia work up No results for input(s): "VITAMINB12", "FOLATE", "FERRITIN", "TIBC", "IRON", "RETICCTPCT" in the last 72 hours. Urinalysis    Component Value Date/Time   COLORURINE YELLOW 01/20/2022 [place] 01/20/2022 1157    LABSPEC 1.010 01/20/2022 1157   PHURINE 6.0 01/20/2022 1157   GLUCOSEU NEGATIVE 01/20/2022 1157   HGBUR SMALL (A) 01/20/2022 1157   BILIRUBINUR NEGATIVE 01/20/2022 1157   KETONESUR 5 (A) 01/20/2022 1157   PROTEINUR >=300 (A) 01/20/2022 1157   NITRITE NEGATIVE 01/20/2022 1157   LEUKOCYTESUR NEGATIVE 01/20/2022 1157   Sepsis Labs Recent Labs  Lab 01/21/22 0550 01/22/22 0507 01/23/22 0434 01/24/22 0316  WBC 10.0 8.9 9.0 9.6   Microbiology Recent Results (from the past 240 hour(s))  Urine Culture     Status: None   Collection Time: 01/20/22 11:57 AM   Specimen: Urine, Catheterized  Result Value Ref Range Status   Specimen Description   Final    URINE, CATHETERIZED Performed at [facility], [address], [place], [place] [address]    Special Requests   Final    NONE Performed at [facility], [address] [place], [place] [address]    Culture   Final    NO GROWTH Performed at [facility], [place] [address] [place], [place] [id number]    Report Status 01/21/2022 FINAL  Final  Blood Culture (routine x 2)     Status: None (Preliminary result)   Collection Time: 01/20/22 12:23 PM   Specimen: BLOOD  Result Value Ref Range Status   Specimen Description BLOOD LEFT ANTECUBITAL  Final   Special Requests   Final    BOTTLES DRAWN AEROBIC ONLY Blood Culture adequate volume   Culture   Final    NO GROWTH 4 DAYS Performed at [facility], [address], [place], [place] [address]    Report Status PENDING  Incomplete  Blood Culture (routine x 2)     Status: None (Preliminary result)   Collection Time: 01/20/22 12:40 PM   Specimen: BLOOD  Result Value Ref Range Status   Specimen Description BLOOD BLOOD LEFT HAND  Final   Special Requests   Final    BOTTLES DRAWN AEROBIC ONLY Blood Culture adequate volume   Culture   Final    NO GROWTH 4 DAYS Performed at [facility], [address] [place], [place] [address]    Report Status PENDING  Incomplete  Resp Panel by RT-PCR  (Flu A&B, Covid) Anterior Nasal Swab     Status: None   Collection Time: 01/20/22 12:45 PM   Specimen: Anterior Nasal Swab  Result Value Ref Range Status   SARS Coronavirus 2 by RT PCR NEGATIVE NEGATIVE Final    Comment: (NOTE) SARS-CoV-2 target nucleic acids are NOT DETECTED.  The SARS-CoV-2 RNA is generally detectable  in upper respiratory specimens during the acute phase of infection. The lowest concentration of SARS-CoV-2 viral copies this assay can detect is 138 copies/mL. A negative result does not preclude SARS-Cov-2 infection and should not be used as the sole basis for treatment or other patient management decisions. A negative result may occur with  improper specimen collection/handling, submission of specimen other than nasopharyngeal swab, presence of viral mutation(s) within the areas targeted by this assay, and inadequate number of viral copies(<138 copies/mL). A negative result must be combined with clinical observations, patient history, and epidemiological information. The expected result is Negative.  Fact Sheet for Patients:  EntrepreneurPulse.com.au  Fact Sheet for Healthcare Providers:  IncredibleEmployment.be  This test is no t yet approved or cleared by the [place] FDA and  has been authorized for detection and/or diagnosis of SARS-CoV-2 by FDA under an Emergency Use Authorization (EUA). This EUA will remain  in effect (meaning this test can be used) for the duration of the COVID-19 declaration under Section 564(b)(1) of the Act, 21 U.S.C.section 360bbb-3(b)(1), unless the authorization is terminated  or revoked sooner.       Influenza A by PCR NEGATIVE NEGATIVE Final   Influenza B by PCR NEGATIVE NEGATIVE Final    Comment: (NOTE) The Xpert Xpress SARS-CoV-2/FLU/RSV plus assay is intended as an aid in the diagnosis of influenza from Nasopharyngeal swab specimens and should not be used as a sole basis for  treatment. Nasal washings and aspirates are unacceptable for Xpert Xpress SARS-CoV-2/FLU/RSV testing.  Fact Sheet for Patients: EntrepreneurPulse.com.au  Fact Sheet for Healthcare Providers: IncredibleEmployment.be  This test is not yet approved or cleared by the [place] FDA and has been authorized for detection and/or diagnosis of SARS-CoV-2 by FDA under an Emergency Use Authorization (EUA). This EUA will remain in effect (meaning this test can be used) for the duration of the COVID-19 declaration under Section 564(b)(1) of the Act, 21 U.S.C. section 360bbb-3(b)(1), unless the authorization is terminated or revoked.  Performed at [facility], [address] [place], [place] [address]   MRSA Next Gen by PCR, Nasal     Status: None   Collection Time: 01/22/22  7:14 AM   Specimen: Nasal Mucosa; Nasal Swab  Result Value Ref Range Status   MRSA by PCR Next Gen NOT DETECTED NOT DETECTED Final    Comment: (NOTE) The GeneXpert MRSA Assay (FDA approved for NASAL specimens only), is one component of a comprehensive MRSA colonization surveillance program. It is not intended to diagnose MRSA infection nor to guide or monitor treatment for MRSA infections. Test performance is not FDA approved in patients less than 87 years old. Performed at [facility], [address], [place], [place] [address]      Time coordinating discharge: 35 minutes  SIGNED:   Rodena Goldmann, DO [facility] 01/24/2022, 10:35 AM  If 7PM-7AM, please contact night-coverage [email or web address]

## 2022-01-24 NOTE — Progress Notes (Signed)
Patient discharged to [facility] [place], report called and given to [facility] LPN. Patient to be transport by [facility] [place], family at bedside. Vital signs stable.

## 2022-01-24 NOTE — Care Management Important Message (Signed)
Important Message  Patient Details  Name: Adriana Stevens MRN: [id number] Date of Birth: 31-May-1925   Medicare Important Message Given:  Yes     Tommy Medal 01/24/2022, 12:49 PM

## 2022-01-24 NOTE — TOC Transition Note (Signed)
Transition of Care [facility]) - CM/SW Discharge Note   Patient Details  Name: Adriana Stevens MRN: [id number] Date of Birth: [birth year]  Transition of Care [facility]) CM/SW Contact:  Salome Arnt, LCSW Phone Number: 01/24/2022, 11:29 AM   Clinical Narrative:  Pt d/c today. Discussed bed offer at [facility] with pt's daughter and she accepts. D/C summary sent to SNF. RN given number to call report. Daughter requests transport via [facility]. Authorization received.     Final next level of care: Skilled Nursing Facility Barriers to Discharge: Barriers Resolved   Patient Goals and CMS Choice Patient states their goals for this hospitalization and ongoing recovery are:: return to ALF CMS Medicare.gov Compare Post Acute Care list provided to:: Patient Represenative (must comment) Choice offered to / list presented to : Adult Children  Discharge Placement              Patient chooses bed at: [facility] Patient to be transferred to facility by: [facility] Name of family member notified: daughter, [name] Patient and family notified of of transfer: 01/24/22  Discharge Plan and Services In-house Referral: Clinical Social Work Discharge Planning Services: CM Consult Post Acute Care Choice: Resumption of Svcs/PTA Provider                               Social Determinants of Health (SDOH) Interventions     Readmission Risk Interventions     No data to display

## 2022-01-26 LAB — CULTURE, BLOOD (ROUTINE X 2)
Culture: NO GROWTH
Culture: NO GROWTH
Special Requests: ADEQUATE
Special Requests: ADEQUATE

## 2022-01-28 IMAGING — US US BREAST*R* LIMITED INC AXILLA
1 series · 13 of 25 positions shown · non-contrast
Comparison: None.

CLINICAL DATA: [AGE] female with a palpable area of concern
in the right breast.

EXAM:
DIGITAL DIAGNOSTIC BILATERAL MAMMOGRAM WITH TOMO AND CAD; ULTRASOUND
RIGHT BREAST LIMITED
TECHNIQUE: Bilateral digital diagnostic mammography and breast tomosynthesis
was performed. Digital images of the breasts were evaluated with
computer-aided detection.

[Series 1: us breast*right* limited inc axilla · 0.07mm/px · 13 of 28 slices shown]
[im 1/28]
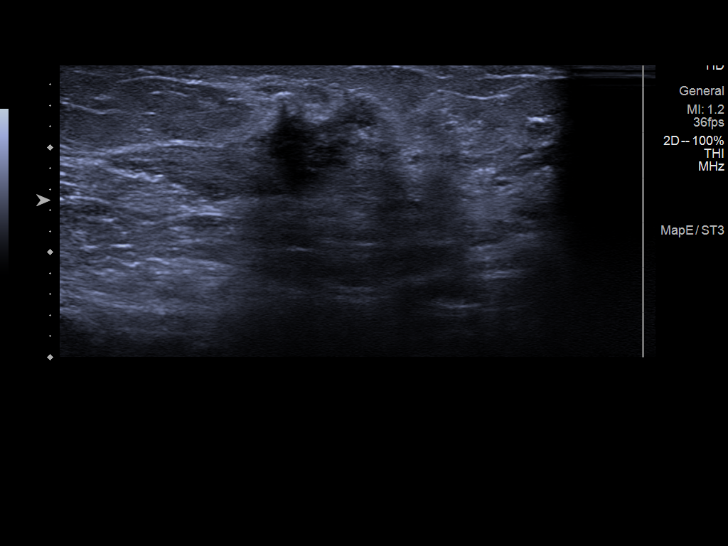
[im 3/28]
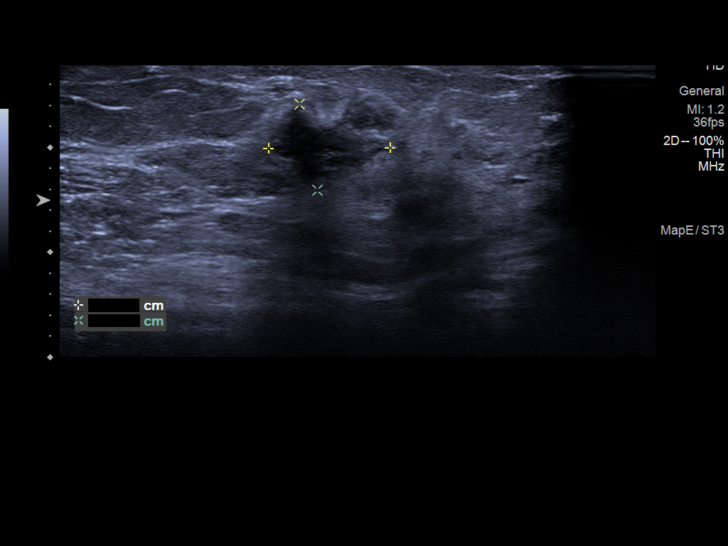
[im 5/28]
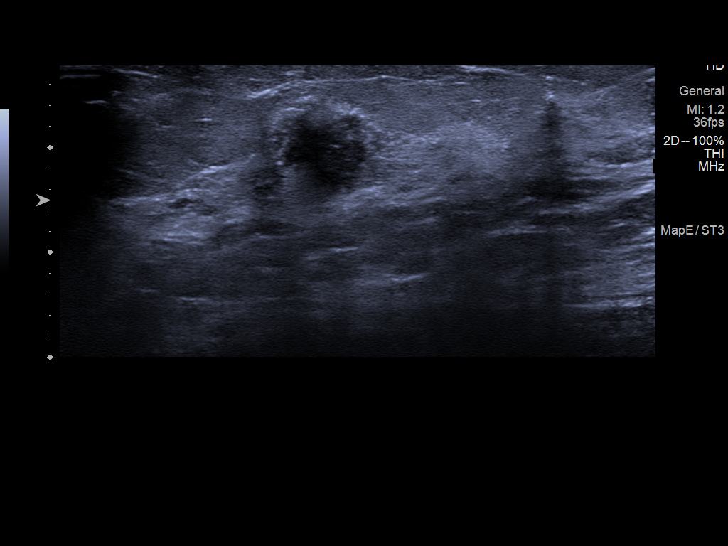
[im 7/28]
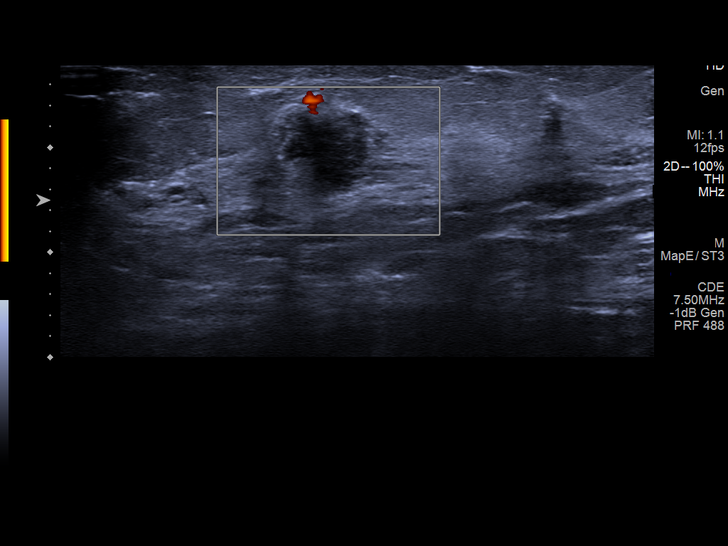
[im 10/28]
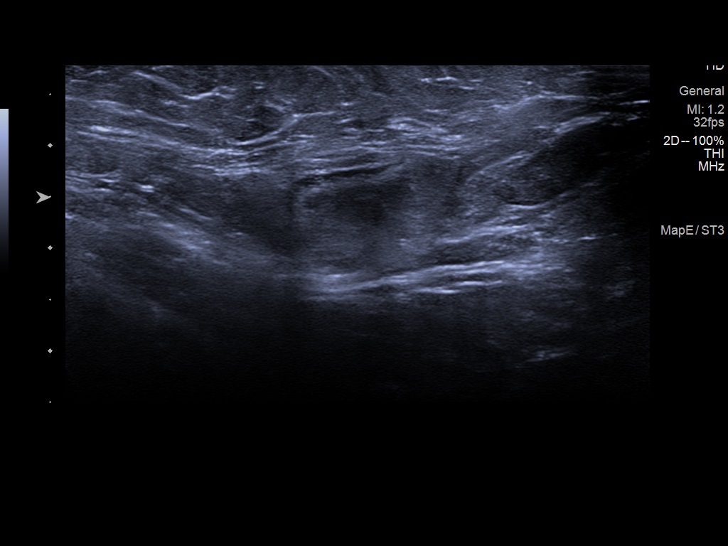
[im 12/28]
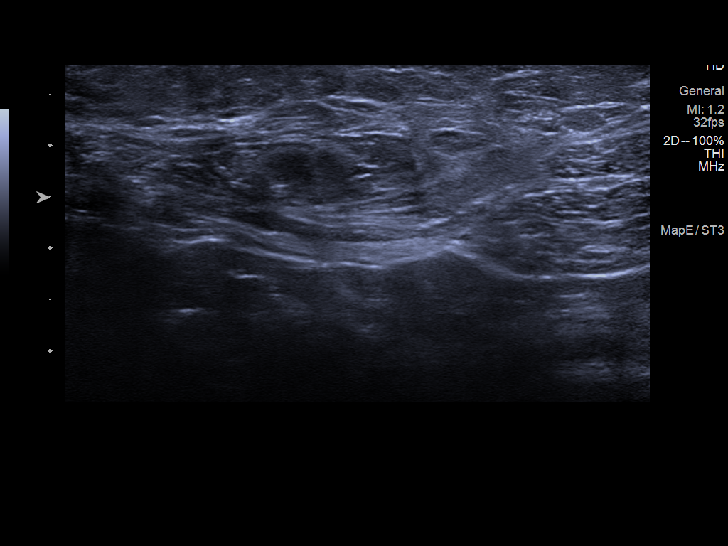
[im 14/28]
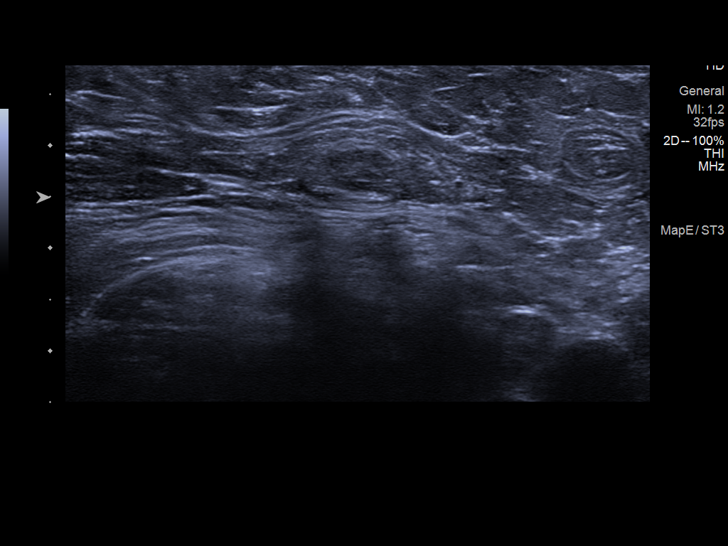
[im 16/28]
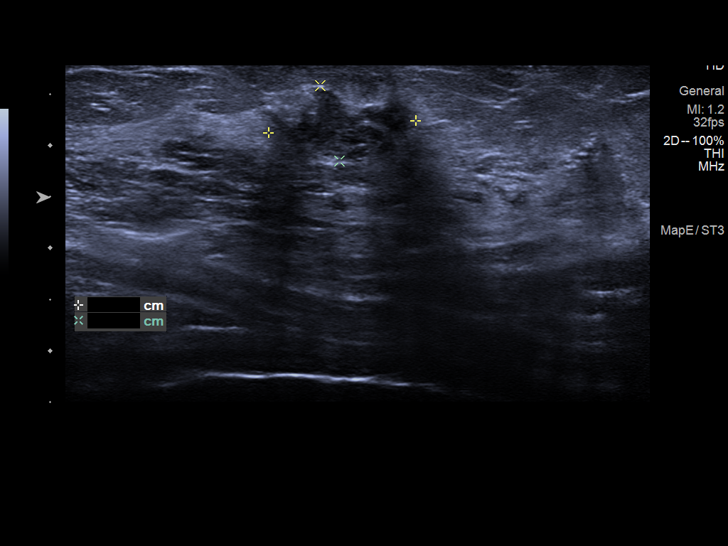
[im 19/28]
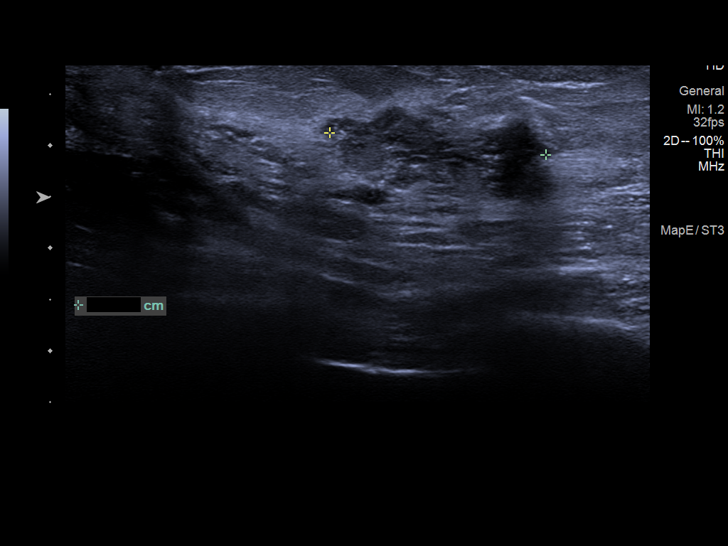
[im 21/28]
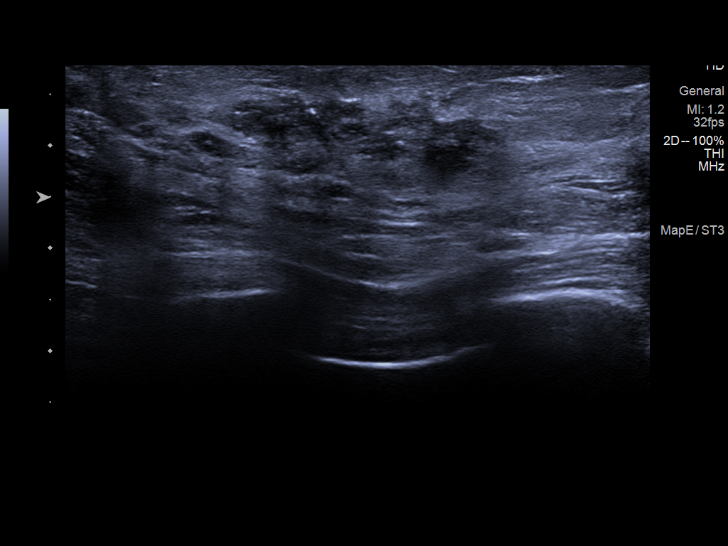
[im 23/28]
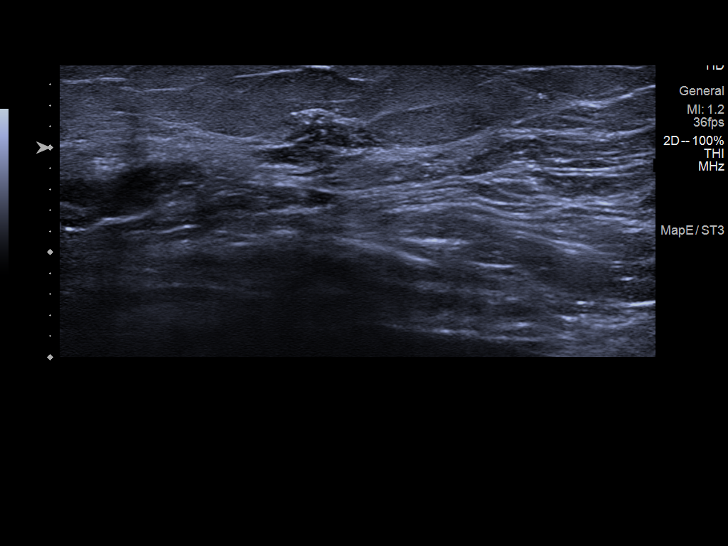
[im 25/28]
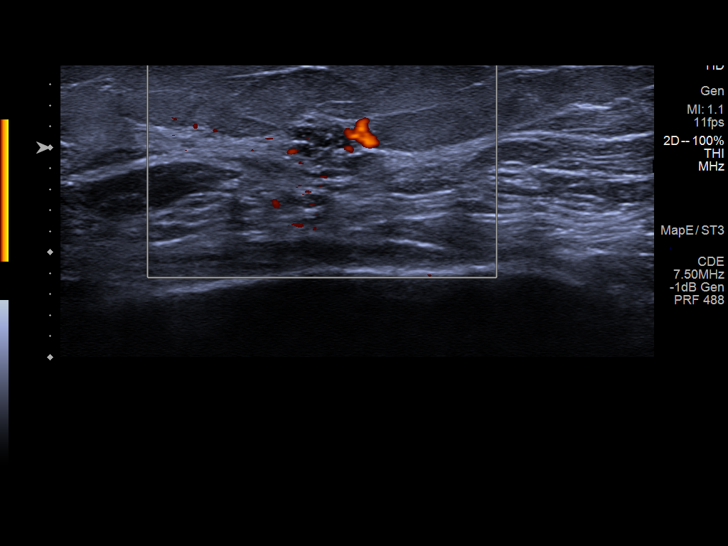
[im 28/28]
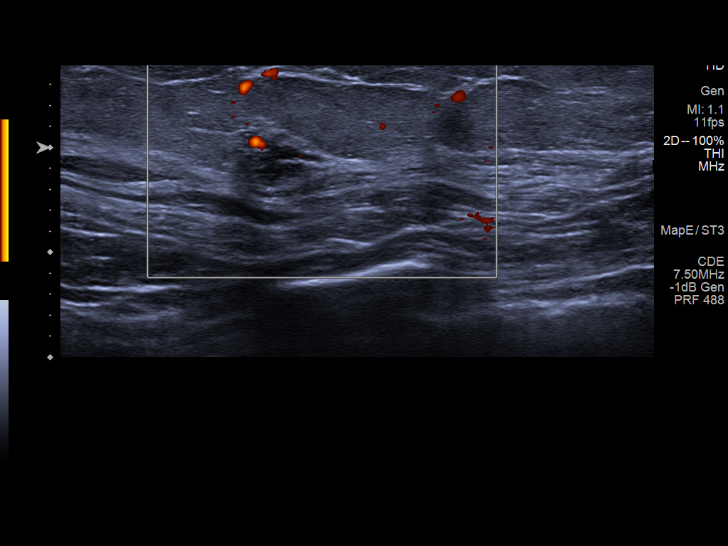

[13 of 25 positions shown; findings below may reference images not displayed]

ACR Breast Density Category b: There are scattered areas of
fibroglandular density.
FINDINGS: There is an irregular somewhat ill-defined mass corresponding the
palpable area of concern in the slightly superior right breast
measuring approximately 2.3 cm. In addition, there is an asymmetry
with associated 2.3 cm group of pleomorphic calcifications in the
inner right breast. No suspicious masses or calcifications are seen
in the left breast.

Physical examination reveals a firm mass in the slightly superior
right breast. Targeted ultrasound of the right breast was performed.
There is an irregular hypoechoic mass in the right breast the 12 to
[DATE] position 2 cm from nipple measuring 1.4 x 0.8 x 2.6 cm. This
corresponds well with the mass seen in the breast. (Note that the
initial images labeled [DATE] 5 cm from nipple are part of the larger
oblong mass labeled 12 o'clock 2 cm from nipple).

Targeted ultrasound of the inner right breast was performed. There
is a hypoechoic mass with calcifications at the [DATE] position 4 cm
from nipple measuring 1.2 x 0.4 x 0.8 cm. This is felt to correspond
with the suspicious calcifications seen in the inner right breast at
mammography.

No lymphadenopathy seen in the right axilla.
IMPRESSION: 1. Suspicious palpable mass in the right breast at the 12 o'clock
position measuring up to 2.6 cm.

2. Additional suspicious mass with calcifications in the inner right
breast, with the calcifications mammographically measuring up to
cm.

RECOMMENDATION:
1. Recommend ultrasound-guided biopsy of the palpable mass in the
right breast at the 12 o'clock position.

2. Recommend ultrasound-guided biopsy of the mass with
calcifications in the right breast the [DATE] position.

I have discussed the findings and recommendations with the patient.
If applicable, a reminder letter will be sent to the patient
regarding the next appointment.

BI-RADS CATEGORY  5: Highly suggestive of malignancy.

## 2022-01-29 ENCOUNTER — Inpatient Hospital Stay: Payer: Medicare Other

## 2022-01-29 ENCOUNTER — Ambulatory Visit (HOSPITAL_COMMUNITY): Admission: RE | Admit: 2022-01-29 | Payer: Medicare Other | Source: Ambulatory Visit

## 2022-02-04 ENCOUNTER — Inpatient Hospital Stay: Payer: Medicare Other | Admitting: Hematology

## 2022-02-11 IMAGING — US US  BREAST BX W/ LOC DEV 1ST LESION IMG BX SPEC US GUIDE*R*
1 series · 11 of 11 positions shown · non-contrast
Comparison: Previous exam(s).
COMPARISON: Previous exam(s).

Addendum:
CLINICAL DATA: Patient presents for ultrasound-guided core needle
biopsy of 2 right breast masses.

EXAM:
ULTRASOUND GUIDED RIGHT BREAST CORE NEEDLE BIOPSY

[Series 1: us breast bx w/ loc dev 1st lesion img bx spec us  · 0.07mm/px · 11 of 11 slices shown]
[im 1/11]
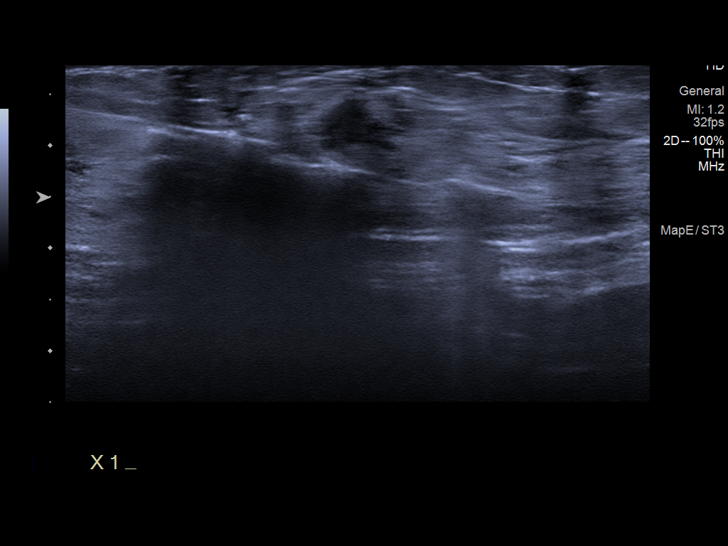
[im 2/11]
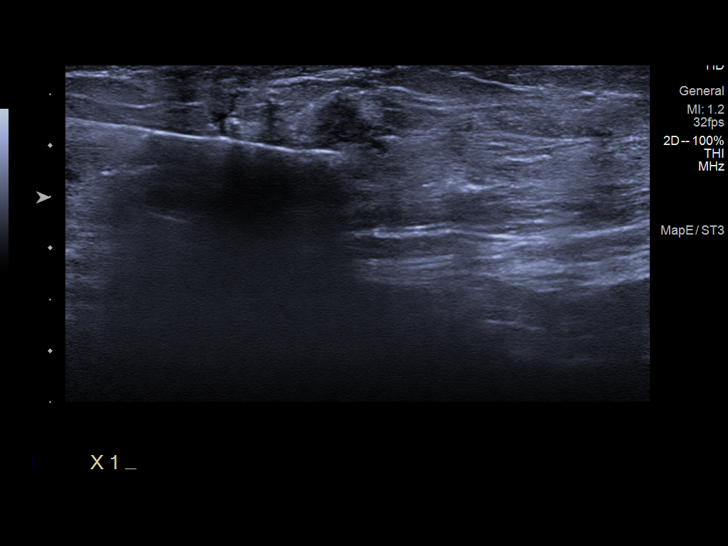
[im 3/11]
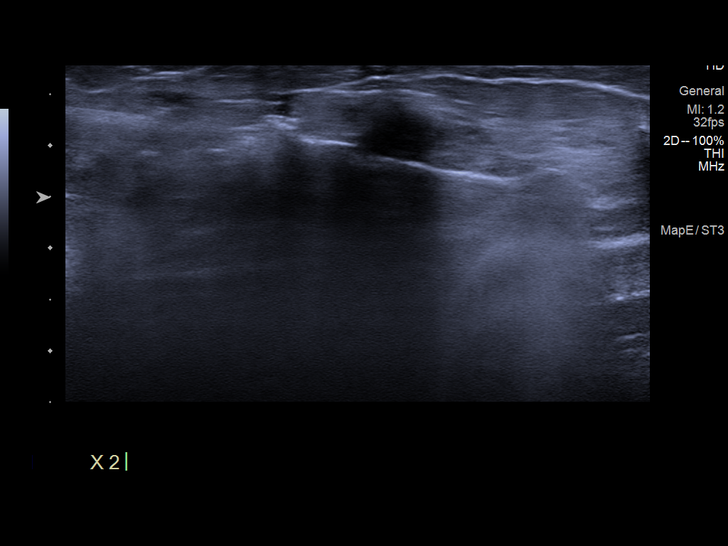
[im 4/11]
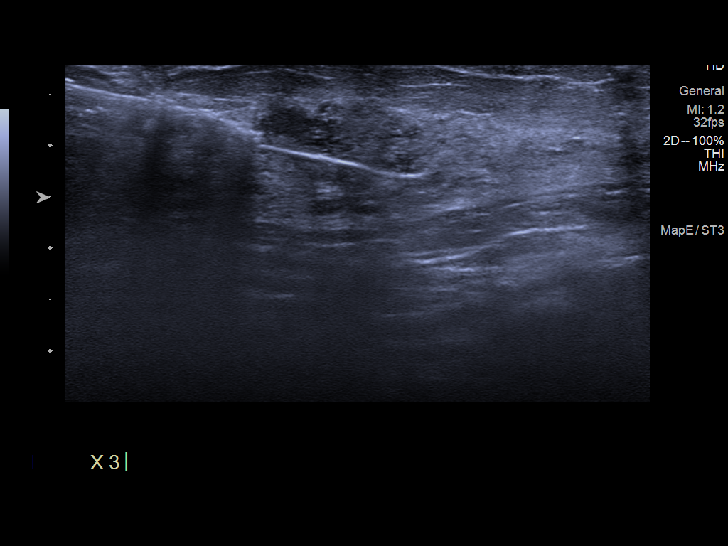
[im 5/11]
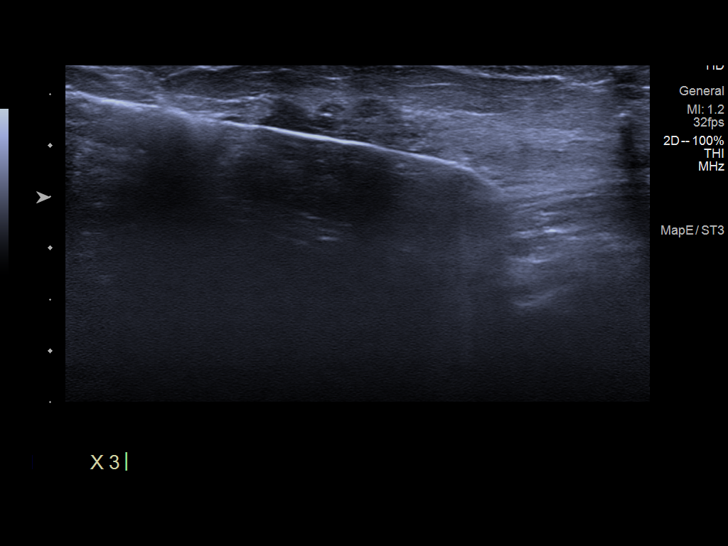
[im 6/11]
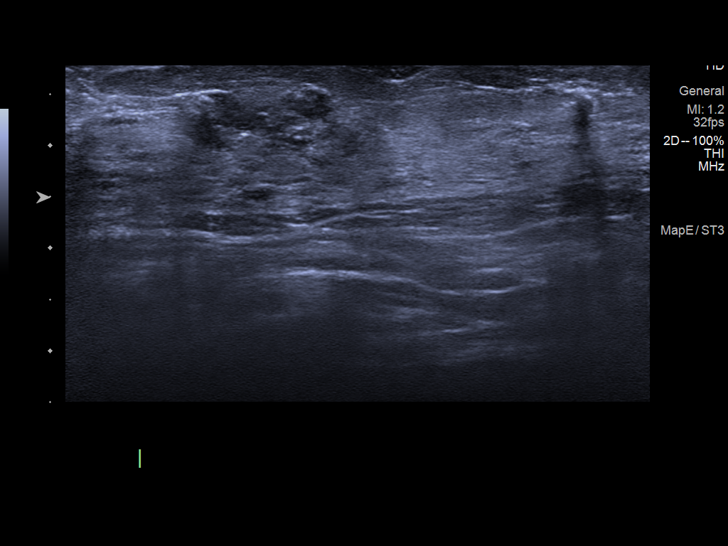
[im 7/11]
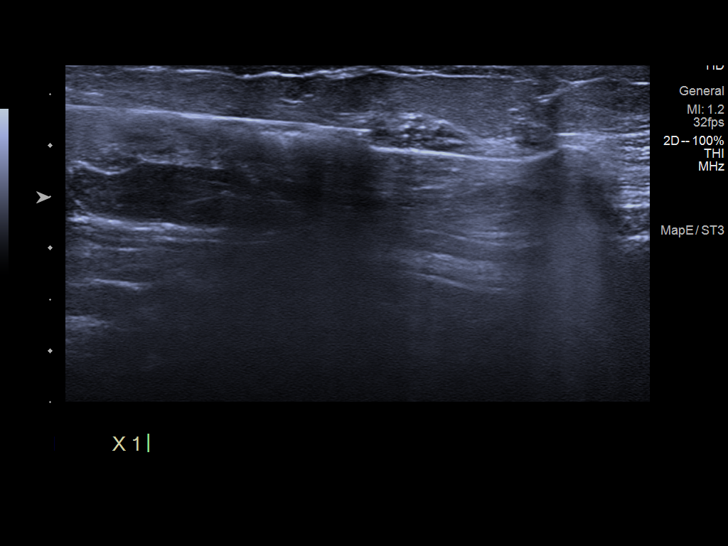
[im 8/11]
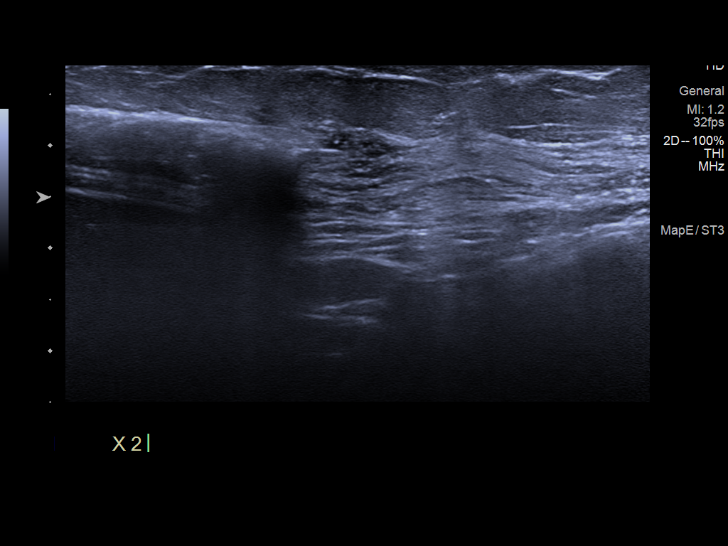
[im 9/11]
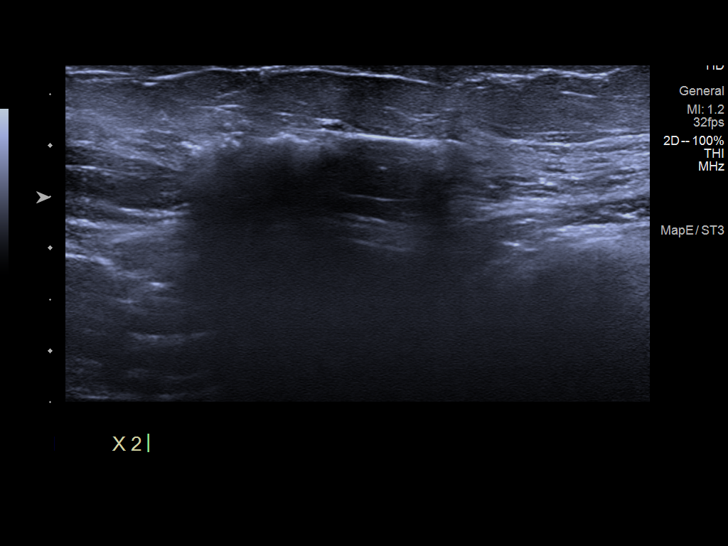
[im 10/11]
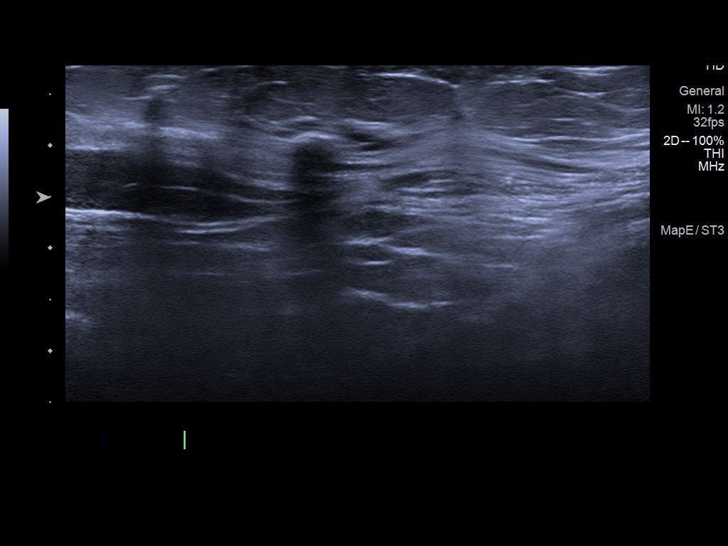
[im 11/11]
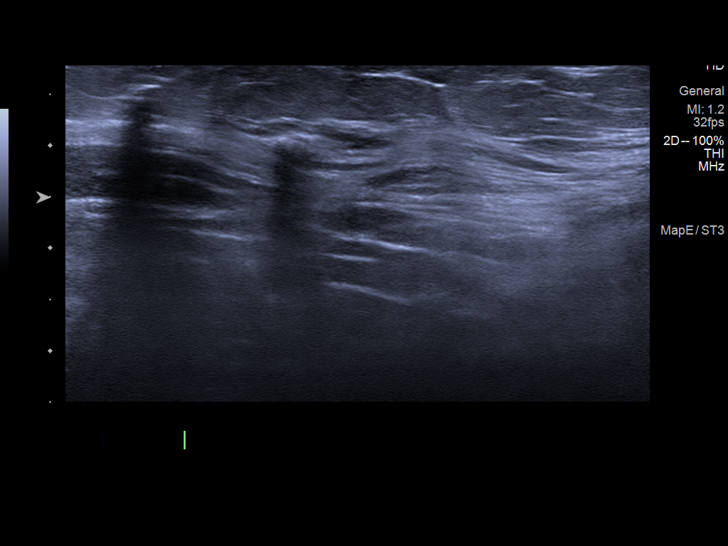

[11 of 11 positions shown; findings below may reference images not displayed]



Lesion #1: 2.6 cm, 12 o'clock position mass.

Lesion quadrant: Upper inner quadrant: Near 12 o'clock, 2 cm from
the nipple.

Using sterile technique and 1% Lidocaine as local anesthetic, under
direct ultrasound visualization, a 12 gauge Winks device was
used to perform biopsy of 12 o'clock position mass using a lateral
approach. At the conclusion of the procedure wing shaped tissue
marker clip was deployed into the biopsy cavity.

Lesion #2: 1.2 cm mass with calcifications at 2:30 o'clock.

Lesion quadrant: Upper inner quadrant

Using sterile technique and 1% Lidocaine as local anesthetic, under
direct ultrasound visualization, a 12 gauge Winks device was
used to perform biopsy of the 2:30 o'clock position mass with
associated calcifications using an inferior approach. At the
conclusion of the procedure ribbon shaped tissue marker clip was
deployed into the biopsy cavity.

Follow up 2 view mammogram was performed and dictated separately.
IMPRESSION: Ultrasound guided biopsy of 2 right breast masses. No apparent
complications.

ADDENDUM:
PATHOLOGY revealed: Site A. BREAST, [DATE], RIGHT, BIOPSY: - Invasive
ductal carcinoma, grade 2. - Ductal carcinoma in situ with
calcifications. - See comment.

Pathology results are CONCORDANT with imaging findings, per Dr.
Mariaserena Bella.

PATHOLOGY revealed: Site B. BREAST, [DATE], RIGHT, BIOPSY: -
High-grade ductal carcinoma in situ with focal necrosis and
calcifications. - Microscopic focus suspicious for invasive ductal
carcinoma. - See comment. COMMENT: The greatest tumor dimension of
invasive carcinoma in a single core is 0.9 cm. Breast prognostic
profile will be performed.

Pathology results are CONCORDANT with imaging findings, per Dr.
Mariaserena Bella.

Pathology results and recommendations below were discussed with
patient's daughter ([employer]) and provider (DR. Jumper
Flavio Roberto) by telephone on 06/21/2020. Provider stated she will meet
with patient face-to-face at nursing facility, and discuss biopsy
results and follow-up plan.

Recommendations: 1. Surgical and oncological consultation: Request
for surgical consultation was relayed to Maria De La Luz Fareed RT at [facility]HOSPITAL] [facility]HOSPITAL] Mammography Department by Mario Alberto Gear RN on
06/21/2020. Provider will team up with Maria De La Luz Fareed RT to arrange
surgical and oncological appointments for patient.

2.  Consider bilateral breast MRI given high grade DCIS.

Pathology results reported by Mario Alberto Gear RN on 06/23/2020.



Lesion #1: 2.6 cm, 12 o'clock position mass.

Lesion quadrant: Upper inner quadrant: Near 12 o'clock, 2 cm from
the nipple.

Using sterile technique and 1% Lidocaine as local anesthetic, under
direct ultrasound visualization, a 12 gauge Winks device was
used to perform biopsy of 12 o'clock position mass using a lateral
approach. At the conclusion of the procedure wing shaped tissue
marker clip was deployed into the biopsy cavity.

Lesion #2: 1.2 cm mass with calcifications at 2:30 o'clock.

Lesion quadrant: Upper inner quadrant

Using sterile technique and 1% Lidocaine as local anesthetic, under
direct ultrasound visualization, a 12 gauge Winks device was
used to perform biopsy of the 2:30 o'clock position mass with
associated calcifications using an inferior approach. At the
conclusion of the procedure ribbon shaped tissue marker clip was
deployed into the biopsy cavity.

Follow up 2 view mammogram was performed and dictated separately.
IMPRESSION: Ultrasound guided biopsy of 2 right breast masses. No apparent
complications.

## 2022-02-11 IMAGING — US US BREAST BX W/ LOC DEV EA ADD LESION IMG BX SPEC US GUIDE*R*
1 series · 11 of 11 positions shown · non-contrast
Comparison: Previous exam(s).
COMPARISON: Previous exam(s).

Addendum:
CLINICAL DATA: Patient presents for ultrasound-guided core needle
biopsy of 2 right breast masses.

EXAM:
ULTRASOUND GUIDED RIGHT BREAST CORE NEEDLE BIOPSY

[Series 1: us breast bx w/ loc dev ea add lesion img bx spec  · 0.07mm/px · 11 of 11 slices shown]
[im 1/11]
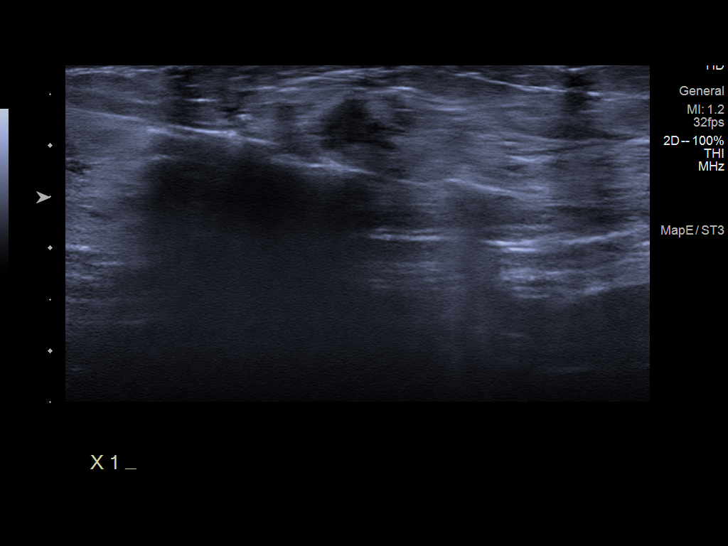
[im 2/11]
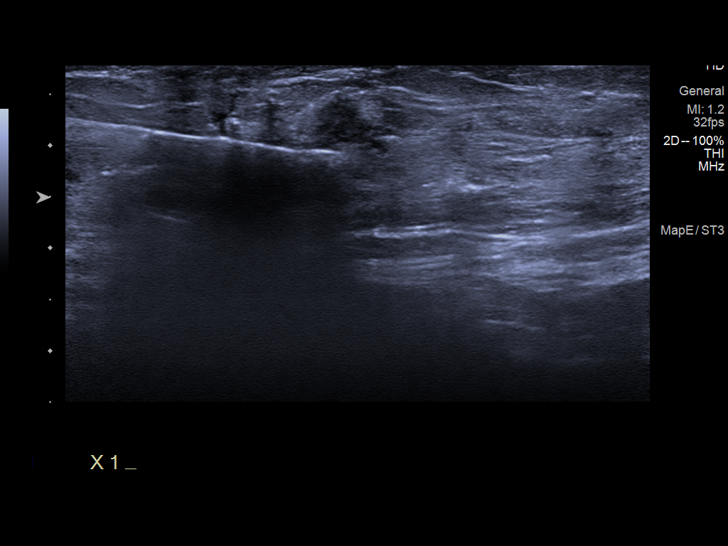
[im 3/11]
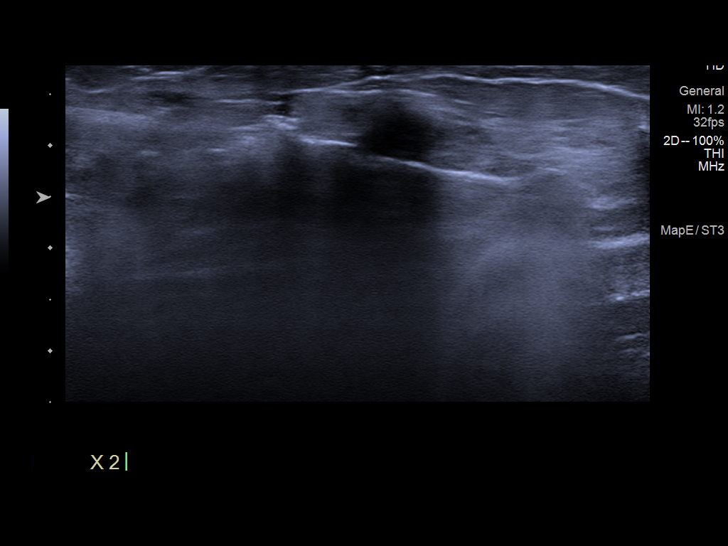
[im 4/11]
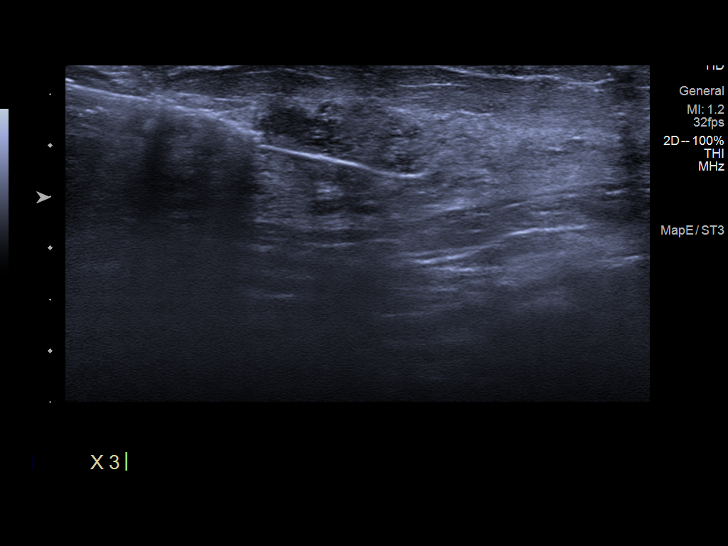
[im 5/11]
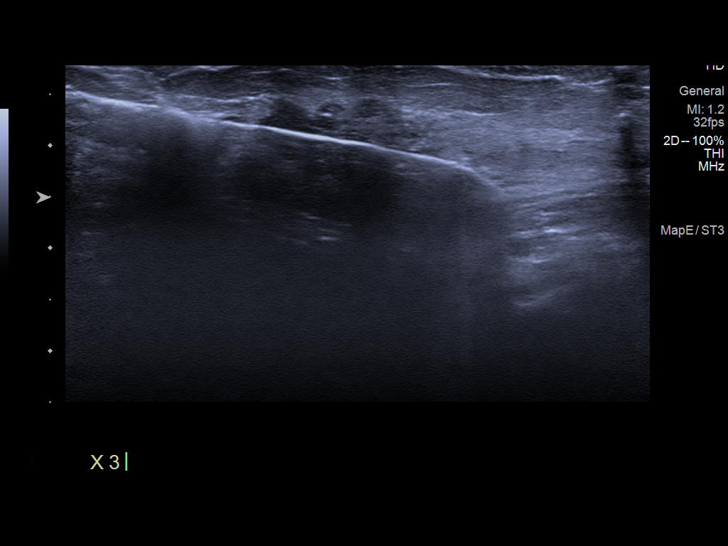
[im 6/11]
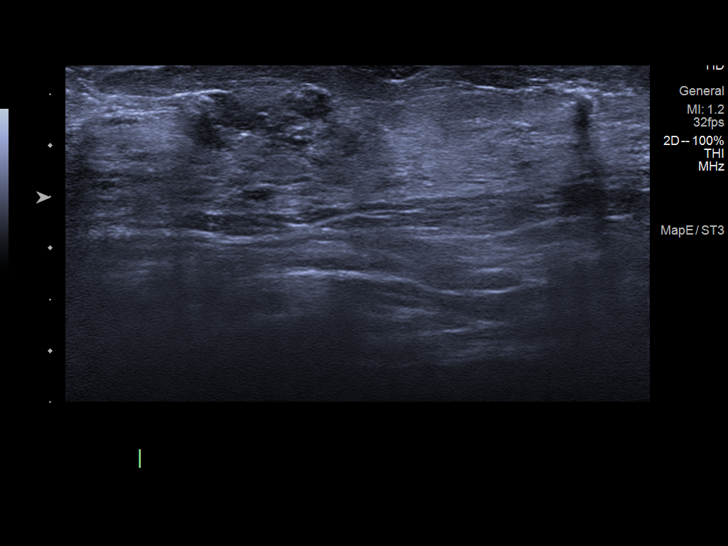
[im 7/11]
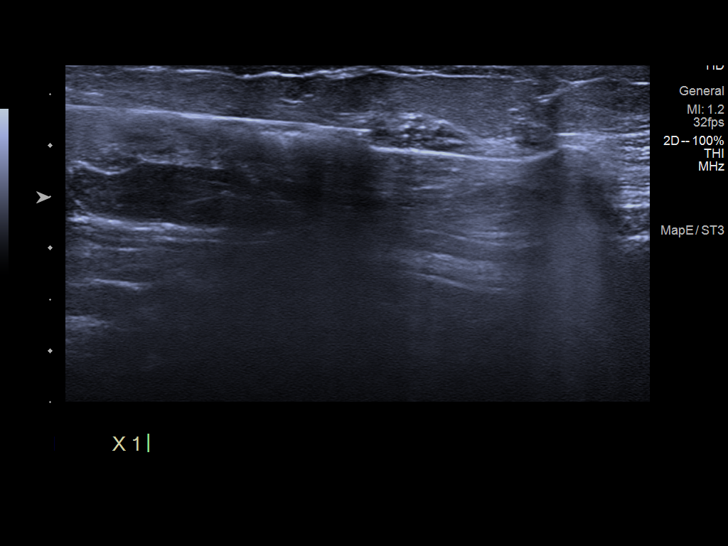
[im 8/11]
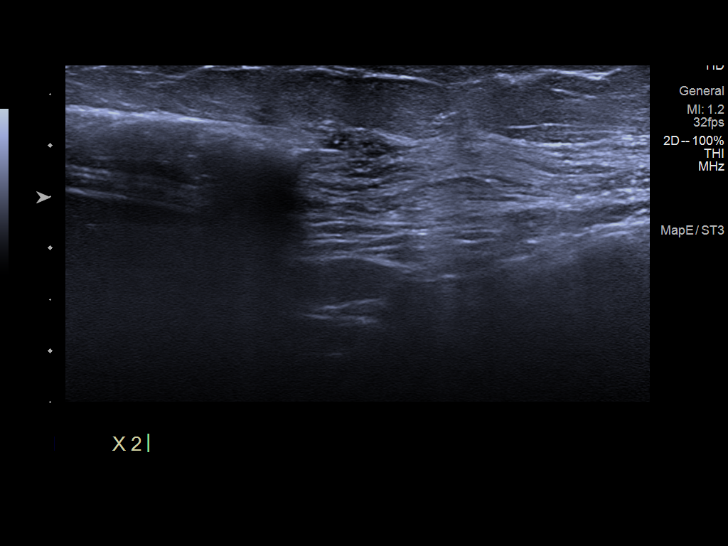
[im 9/11]
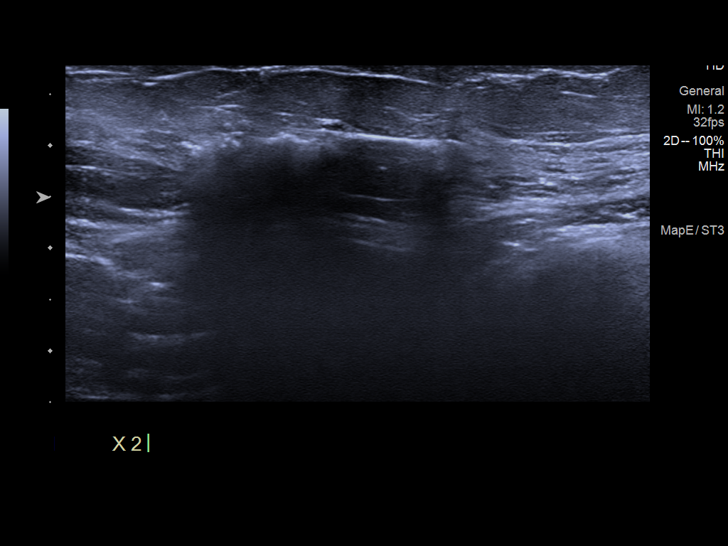
[im 10/11]
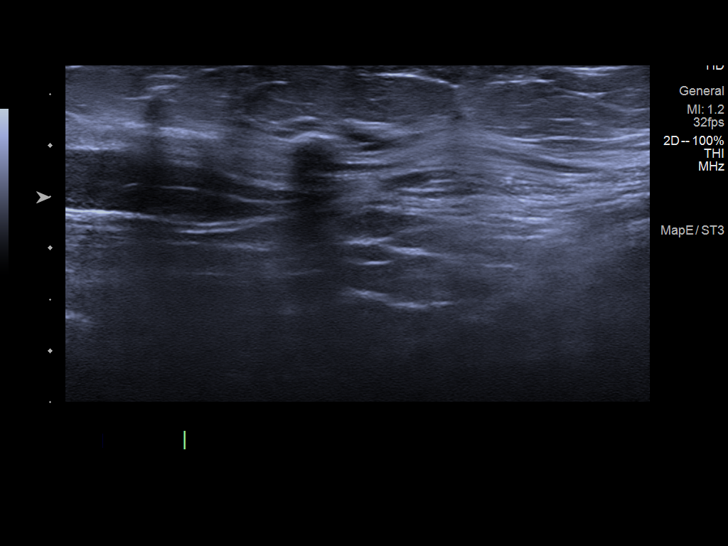
[im 11/11]
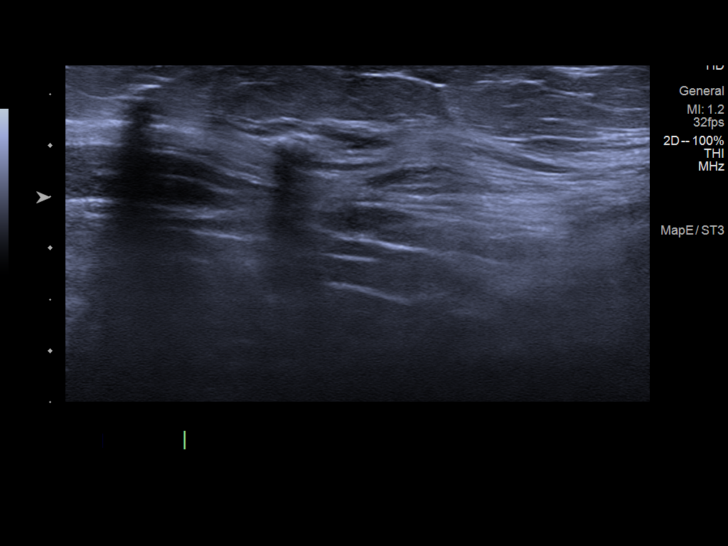

[11 of 11 positions shown; findings below may reference images not displayed]



Lesion #1: 2.6 cm, 12 o'clock position mass.

Lesion quadrant: Upper inner quadrant: Near 12 o'clock, 2 cm from
the nipple.

Using sterile technique and 1% Lidocaine as local anesthetic, under
direct ultrasound visualization, a 12 gauge Winks device was
used to perform biopsy of 12 o'clock position mass using a lateral
approach. At the conclusion of the procedure wing shaped tissue
marker clip was deployed into the biopsy cavity.

Lesion #2: 1.2 cm mass with calcifications at 2:30 o'clock.

Lesion quadrant: Upper inner quadrant

Using sterile technique and 1% Lidocaine as local anesthetic, under
direct ultrasound visualization, a 12 gauge Winks device was
used to perform biopsy of the 2:30 o'clock position mass with
associated calcifications using an inferior approach. At the
conclusion of the procedure ribbon shaped tissue marker clip was
deployed into the biopsy cavity.

Follow up 2 view mammogram was performed and dictated separately.
IMPRESSION: Ultrasound guided biopsy of 2 right breast masses. No apparent
complications.

ADDENDUM:
PATHOLOGY revealed: Site A. BREAST, [DATE], RIGHT, BIOPSY: - Invasive
ductal carcinoma, grade 2. - Ductal carcinoma in situ with
calcifications. - See comment.

Pathology results are CONCORDANT with imaging findings, per Dr.
Mariaserena Bella.

PATHOLOGY revealed: Site B. BREAST, [DATE], RIGHT, BIOPSY: -
High-grade ductal carcinoma in situ with focal necrosis and
calcifications. - Microscopic focus suspicious for invasive ductal
carcinoma. - See comment. COMMENT: The greatest tumor dimension of
invasive carcinoma in a single core is 0.9 cm. Breast prognostic
profile will be performed.

Pathology results are CONCORDANT with imaging findings, per Dr.
Mariaserena Bella.

Pathology results and recommendations below were discussed with
patient's daughter ([employer]) and provider (DR. Jumper
Flavio Roberto) by telephone on 06/21/2020. Provider stated she will meet
with patient face-to-face at nursing facility, and discuss biopsy
results and follow-up plan.

Recommendations: 1. Surgical and oncological consultation: Request
for surgical consultation was relayed to Maria De La Luz Fareed RT at [facility]HOSPITAL] [facility]HOSPITAL] Mammography Department by Mario Alberto Gear RN on
06/21/2020. Provider will team up with Maria De La Luz Fareed RT to arrange
surgical and oncological appointments for patient.

2.  Consider bilateral breast MRI given high grade DCIS.

Pathology results reported by Mario Alberto Gear RN on 06/23/2020.



Lesion #1: 2.6 cm, 12 o'clock position mass.

Lesion quadrant: Upper inner quadrant: Near 12 o'clock, 2 cm from
the nipple.

Using sterile technique and 1% Lidocaine as local anesthetic, under
direct ultrasound visualization, a 12 gauge Winks device was
used to perform biopsy of 12 o'clock position mass using a lateral
approach. At the conclusion of the procedure wing shaped tissue
marker clip was deployed into the biopsy cavity.

Lesion #2: 1.2 cm mass with calcifications at 2:30 o'clock.

Lesion quadrant: Upper inner quadrant

Using sterile technique and 1% Lidocaine as local anesthetic, under
direct ultrasound visualization, a 12 gauge Winks device was
used to perform biopsy of the 2:30 o'clock position mass with
associated calcifications using an inferior approach. At the
conclusion of the procedure ribbon shaped tissue marker clip was
deployed into the biopsy cavity.

Follow up 2 view mammogram was performed and dictated separately.
IMPRESSION: Ultrasound guided biopsy of 2 right breast masses. No apparent
complications.

## 2022-02-26 ENCOUNTER — Ambulatory Visit (HOSPITAL_COMMUNITY)
Admission: RE | Admit: 2022-02-26 | Discharge: 2022-02-26 | Disposition: A | Discharge status: Home / Self Care | Payer: Medicare Other | Source: Ambulatory Visit | Attending: Hematology | Admitting: Hematology

## 2022-02-26 ENCOUNTER — Inpatient Hospital Stay: Payer: Medicare Other | Attending: Hematology

## 2022-02-26 DIAGNOSIS — C50211 Malignant neoplasm of upper-inner quadrant of right female breast: Secondary | ICD-10-CM

## 2022-02-26 DIAGNOSIS — I10 Essential (primary) hypertension: Secondary | ICD-10-CM | POA: Insufficient documentation

## 2022-02-26 DIAGNOSIS — C50811 Malignant neoplasm of overlapping sites of right female breast: Secondary | ICD-10-CM | POA: Insufficient documentation

## 2022-02-26 DIAGNOSIS — Z170 Estrogen receptor positive status [ER+]: Secondary | ICD-10-CM | POA: Insufficient documentation

## 2022-02-26 DIAGNOSIS — Z79899 Other long term (current) drug therapy: Secondary | ICD-10-CM | POA: Insufficient documentation

## 2022-02-26 DIAGNOSIS — Z79811 Long term (current) use of aromatase inhibitors: Secondary | ICD-10-CM | POA: Diagnosis not present

## 2022-02-26 DIAGNOSIS — Z803 Family history of malignant neoplasm of breast: Secondary | ICD-10-CM | POA: Diagnosis not present

## 2022-02-26 DIAGNOSIS — M8580 Other specified disorders of bone density and structure, unspecified site: Secondary | ICD-10-CM | POA: Diagnosis not present

## 2022-02-26 LAB — CBC WITH DIFFERENTIAL/PLATELET
Abs Immature Granulocytes: 0.04 10*3/uL (ref 0.00–0.07)
Basophils Absolute: 0 10*3/uL (ref 0.0–0.1)
Basophils Relative: 0 %
Eosinophils Absolute: 0.1 10*3/uL (ref 0.0–0.5)
Eosinophils Relative: 1 %
HCT: 30.6 % — ABNORMAL LOW (ref 36.0–46.0)
Hemoglobin: 9.5 g/dL — ABNORMAL LOW (ref 12.0–15.0)
Immature Granulocytes: 0 %
Lymphocytes Relative: 12 %
Lymphs Abs: 1.2 10*3/uL (ref 0.7–4.0)
MCH: 29.5 pg (ref 26.0–34.0)
MCHC: 31 g/dL (ref 30.0–36.0)
MCV: 95 fL (ref 80.0–100.0)
Monocytes Absolute: 0.6 10*3/uL (ref 0.1–1.0)
Monocytes Relative: 7 %
Neutro Abs: 7.4 10*3/uL (ref 1.7–7.7)
Neutrophils Relative %: 80 %
Platelets: 222 10*3/uL (ref 150–400)
RBC: 3.22 MIL/uL — ABNORMAL LOW (ref 3.87–5.11)
RDW: 14.2 % (ref 11.5–15.5)
WBC: 9.3 10*3/uL (ref 4.0–10.5)
nRBC: 0 % (ref 0.0–0.2)

## 2022-02-26 LAB — COMPREHENSIVE METABOLIC PANEL
ALT: 12 U/L (ref 0–44)
AST: 16 U/L (ref 15–41)
Albumin: 2.9 g/dL — ABNORMAL LOW (ref 3.5–5.0)
Alkaline Phosphatase: 79 U/L (ref 38–126)
Anion gap: 8 (ref 5–15)
BUN: 39 mg/dL — ABNORMAL HIGH (ref 8–23)
CO2: 26 mmol/L (ref 22–32)
Calcium: 8.9 mg/dL (ref 8.9–10.3)
Chloride: 106 mmol/L (ref 98–111)
Creatinine, Ser: 1.3 mg/dL — ABNORMAL HIGH (ref 0.44–1.00)
GFR, Estimated: 38 mL/min — ABNORMAL LOW (ref >60)
Glucose, Bld: 133 mg/dL — ABNORMAL HIGH (ref 70–99)
Potassium: 4.3 mmol/L (ref 3.5–5.1)
Sodium: 140 mmol/L (ref 135–145)
Total Bilirubin: 0.6 mg/dL (ref 0.3–1.2)
Total Protein: 6.7 g/dL (ref 6.5–8.1)

## 2022-02-26 LAB — VITAMIN D 25 HYDROXY (VIT D DEFICIENCY, FRACTURES): Vit D, 25-Hydroxy: 33.02 ng/mL (ref 30–100)

## 2022-03-04 ENCOUNTER — Encounter: Payer: Self-pay | Admitting: Neurology

## 2022-03-04 ENCOUNTER — Ambulatory Visit (INDEPENDENT_AMBULATORY_CARE_PROVIDER_SITE_OTHER): Payer: Medicare Other | Admitting: Neurology

## 2022-03-04 VITALS — BP 118/72 | HR 54

## 2022-03-04 DIAGNOSIS — F03B18 Unspecified dementia, moderate, with other behavioral disturbance: Secondary | ICD-10-CM | POA: Diagnosis not present

## 2022-03-04 MED ORDER — DONEPEZIL HCL 5 MG PO TABS: 5.0000 mg | ORAL_TABLET | Freq: Every day | ORAL | 3 refills | Status: DC

## 2022-03-04 NOTE — Progress Notes (Signed)
[facility]  PATIENT: Adriana Stevens DOB: 03-15-1926  REQUESTING CLINICIAN: Vidal Schwalbe, MD HISTORY FROM: Daughter and chart review  REASON FOR VISIT: Worsening memory since discharge from the hospital    HISTORICAL  CHIEF COMPLAINT:  Chief Complaint  Patient presents with   New Patient (Initial Visit)    Rm 82, with daughter  NP ED referral for dementia eval/sched with Reubin Milan - [phone] Reports memory decline within the last month     HISTORY OF PRESENT ILLNESS:  This is a 88 year old woman past medical history hypertension, hyperlipidemia, memory decline, glaucoma who is presenting with her daughter for worsening memory.  Per daughter, before her recent hospitalization patient was able to get out of the wheelchair, she did not have to wear a pad, she was able to feed herself.  She was still struggling with memory but was able to recognize her.  She was admitted to the hospital in September 21 for sepsis due to UTI.  Since discharge from the hospital daughter has reported her memory is worse, at times she will not recognize as she will call by her sister name.  She can no longer transfer from the chair to the bed, she cannot feed herself and now she has to wear diaper pads . She sometimes reports seeing her deceased siblings also seeing her deceased children and she feels like her memory is worse than prior to the hospitalization. Daughter has also reported that sometimes patient talks about little children. While she was hospitalized she did have an MRI brain with did not show any acute intracranial abnormality.   Functional status: Dependent in all ADLs and IADLs Patient lives with nursing home. Cooking: no Cleaning: no Shopping: no Bathing: no, needs help Toileting: no, needs help Driving: no Bills: no Medications: needs help getting her medication Ever left the stove on by accident?: n/a Forget how to use items around the  house?: yes Getting lost going to familiar places?: yes Forgetting loved ones names?: yes Word finding difficulty? yes Sleep: daughter reports sleep cycle disruption    [name] is a 88 y.o. female with medical history significant for HTN, Dementia, blind. [facility] reports of altered mental status, confusion, and reports that patient's urine had a strong smell, symptoms have been ongoing over the past 5 days.  Reportedly patient was somnolent to obtunded. Also with vomiting and poor oral intake.  Patient was admitted with sepsis, present on admission with associated toxic, metabolic encephalopathy in the setting of dementia.  She did not have an acute source of infection noted and was maintained on initial IV antibiotics which was then transitioned to oral amoxicillin which she is now tolerating.  She was noncompliant with her home medications including blood pressure agents and did have some elevated blood pressure readings requiring increased dosing of antihypertensives which have been provided.  She is now in stable condition for discharge to SNF per PT recommendations and will need outpatient follow-up with neurology to assess for dementia as well.  Recommendations for outpatient palliative care as well due to advanced age and dementia.  OTHER MEDICAL CONDITIONS: Hypertension, hyperlipidemia, glaucoma, blindness   REVIEW OF SYSTEMS: Full 14 system review of systems performed and negative with exception of: Unable to fully obtain due to mental status   ALLERGIES: No Known Allergies  HOME MEDICATIONS: Outpatient Medications Prior to Visit  Medication Sig Dispense Refill   alum & mag hydroxide-simeth (  MAALOX/MYLANTA) 200-200-20 MG/5ML suspension Take 30 mLs by mouth every 6 (six) hours as needed for indigestion or heartburn.     Amino Acids-Protein Hydrolys (FEEDING SUPPLEMENT, PRO-STAT 64,) LIQD Take 30 mLs by mouth daily.      amLODipine (NORVASC) 10 MG tablet Take 10 mg by mouth daily.     anastrozole (ARIMIDEX) 1 MG tablet Take 1 tablet (1 mg total) by mouth daily. 30 tablet 6   Ascorbic Acid 500 MG/15ML LIQD 1 tablet     ASPERCREME LIDOCAINE 4 % PTCH Apply 1 patch topically daily.     cloNIDine (CATAPRES - DOSED IN MG/24 HR) 0.3 mg/24hr patch Place 1 patch (0.3 mg total) onto the skin once a week. 4 patch 12   diclofenac Sodium (VOLTAREN) 1 % GEL See admin instructions.     docusate sodium (COLACE) 100 MG capsule Take 100 mg by mouth daily.     famotidine (PEPCID) 20 MG tablet Take 20 mg by mouth 2 (two) times daily.     gabapentin (NEURONTIN) 100 MG capsule Take 100 mg by mouth at bedtime.     hydroxypropyl methylcellulose / hypromellose (ISOPTO TEARS / GONIOVISC) 2.5 % ophthalmic solution 1 drop 4 (four) times daily.     latanoprost (XALATAN) 0.005 % ophthalmic solution SMARTSIG:In Eye(s)     loteprednol (LOTEMAX) 0.5 % ophthalmic suspension SMARTSIG:In Eye(s)     Magnesium 400 MG TABS Take 1 tablet by mouth at bedtime.     magnesium hydroxide (MILK OF MAGNESIA) 400 MG/5ML suspension Take 30 mLs by mouth daily as needed for mild constipation.     melatonin 3 MG TABS tablet Take 3 mg by mouth at bedtime.     Multiple Vitamin (TAB-A-VITE) TABS Take 1 tablet by mouth daily.     nitroGLYCERIN (NITROSTAT) 0.4 MG SL tablet Place 0.4 mg under the tongue every 5 (five) minutes as needed for chest pain.     olmesartan-hydrochlorothiazide (BENICAR HCT) 40-12.5 MG tablet Take 1 tablet by mouth daily.     potassium chloride SA (KLOR-CON) 20 MEQ tablet Take 20 mEq by mouth daily.     senna (SENOKOT) 8.6 MG TABS tablet Take 2 tablets by mouth at bedtime.     sodium chloride (MURO 128) 5 % ophthalmic solution SMARTSIG:In Eye(s)     SYSTANE 0.4-0.3 % SOLN Apply to eye.     labetalol (NORMODYNE) 200 MG tablet Take 1 tablet (200 mg total) by mouth 2 (two) times daily. 60 tablet 0   traZODone (DESYREL) 50 MG tablet Take 50 mg  by mouth at bedtime as needed for sleep.     No facility-administered medications prior to visit.    PAST MEDICAL HISTORY: Past Medical History:  Diagnosis Date   Cerebral infarction ([place])    Glaucoma    Hyperlipemia    Hypertension    Osteoarthritis    TIA (transient ischemic attack)     PAST SURGICAL HISTORY: Past Surgical History:  Procedure Laterality Date   EYE SURGERY N/A     FAMILY HISTORY: Family History  Problem Relation Age of Onset   Heart attack Mother    Stroke Father    Arthritis Sister    Hypertension Sister    Cancer Brother    Hypertension Brother     SOCIAL HISTORY: Social History   Socioeconomic History   Marital status: Unknown    Spouse name: Not on file   Number of children: Not on file   Years of education: Not on file  Highest education level: Not on file  Occupational History   Not on file  Tobacco Use   Smoking status: Never   Smokeless tobacco: Never  Substance and Sexual Activity   Alcohol use: No    Alcohol/week: 0.0 standard drinks of alcohol   Drug use: No   Sexual activity: Not on file  Other Topics Concern   Not on file  Social History Narrative   Not on file   Social Determinants of Health   Financial Resource Strain: Low Risk  (08/01/2020)   Overall Financial Resource Strain (CARDIA)    Difficulty of Paying Living Expenses: Not hard at all  Food Insecurity: No Food Insecurity (08/01/2020)   Hunger Vital Sign    Worried About Running Out of Food in the Last Year: Never true    [place] in the Last Year: Never true  Transportation Needs: No Transportation Needs (08/01/2020)   PRAPARE - Hydrologist (Medical): No    Lack of Transportation (Non-Medical): No  Physical Activity: Inactive (08/01/2020)   Exercise Vital Sign    Days of Exercise per Week: 0 days    Minutes of Exercise per Session: 0 min  Stress: No Stress Concern Present (08/01/2020)   [place]    Feeling of Stress : Not at all  Social Connections: Moderately Isolated (08/01/2020)   Social Connection and Isolation Panel [NHANES]    Frequency of Communication with Friends and Family: More than three times a week    Frequency of Social Gatherings with Friends and Family: More than three times a week    Attends Religious Services: 1 to 4 times per year    Active Member of [employer] or Organizations: No    Attends Archivist Meetings: Never    Marital Status: Widowed  Intimate Partner Violence: Not At Risk (08/01/2020)   Humiliation, Afraid, Rape, and Kick questionnaire    Fear of Current or Ex-Partner: No    Emotionally Abused: No    Physically Abused: No    Sexually Abused: No     PHYSICAL EXAM  GENERAL EXAM/CONSTITUTIONAL: Vitals:  Vitals:   03/04/22 1519  BP: 118/72  Pulse: (!) 54   There is no height or weight on file to calculate BMI. Wt Readings from Last 3 Encounters:  01/20/22 165 lb (74.8 kg)  07/27/21 170 lb 6.4 oz (77.3 kg)  06/22/21 159 lb (72.1 kg)   Patient is in no distress; well developed, nourished and groomed; neck is supple    NEUROLOGIC: MENTAL STATUS:      No data to display         awake, alert, oriented to person, but not place or time. At first she called daughter by her sister name because calling her by her real name. She cannot recall daughter birthday With prompting, she can state the days of the week forward, cannot state them backward  She is unable to count finger but able to track examiner.  Her extremities are at least antigravity She is unable to complete a [place] or MMSE.  She is in wheelchair      DIAGNOSTIC DATA (LABS, IMAGING, TESTING) - I reviewed patient records, labs, notes, testing and imaging myself where available.  Lab Results  Component Value Date   WBC 9.3 02/26/2022   HGB 9.5 (L) 02/26/2022   HCT 30.6 (L) 02/26/2022   MCV 95.0 02/26/2022   PLT 222  02/26/2022      Component Value Date/Time   NA 140 02/26/2022 1044   K 4.3 02/26/2022 1044   CL 106 02/26/2022 1044   CO2 26 02/26/2022 1044   GLUCOSE 133 (H) 02/26/2022 1044   BUN 39 (H) 02/26/2022 1044   CREATININE 1.30 (H) 02/26/2022 1044   CALCIUM 8.9 02/26/2022 1044   PROT 6.7 02/26/2022 1044   ALBUMIN 2.9 (L) 02/26/2022 1044   AST 16 02/26/2022 1044   ALT 12 02/26/2022 1044   ALKPHOS 79 02/26/2022 1044   BILITOT 0.6 02/26/2022 1044   GFRNONAA 38 (L) 02/26/2022 1044   No results found for: "CHOL", "HDL", "LDLCALC", "LDLDIRECT", "TRIG", "CHOLHDL" No results found for: "HGBA1C" No results found for: "VITAMINB12" No results found for: "TSH"  CT Head 01/20/22 Atrophy and chronic white matter changes.  No acute abnormality.    ASSESSMENT AND PLAN  88 y.o. year old female with hypertension, hyperlipidemia, glaucoma, who is presenting with worsening of memory since her recent admission on September 17.  Prior to the admission daughter has recognized patient has memory deficit but since discharge from the hospital it is worse.  On exam today she is awake she is alert she is not oriented to date or time.  She clearly has memory deficit as she cannot tell me what she eats for dinner last night, could not remember her daughter's birthday.  She is Dependent in all IADLs. She was unable to perform MoCA or MMSE.  There are also report of hallucinations.  I do believe the patient has moderate dementia with some behavioral disturbance, it is unclear if the hallucinations are Charles-Bonnet syndrome versus hallucination from dementia.  I will start her on Aricept 5 mg nightly, we discussed side effect of medication including dizziness, bradycardia, diarrhea and vivid dreams.  If patient experiences any of the symptoms, I have advised them to discontinue the medication. I will also obtain Vitamin B12 and TSH. Follow up in 1 year or sooner if worse.    1. Moderate dementia with other behavioral  disturbance, unspecified dementia type [facility])      Patient Instructions  Start with Aricept 5 mg nightly  Will obtain Vitamin B12 and TSH today  Continue your other medications  Follow up in 1 year or sooner if worse, if there is increase behavioral changes   Orders Placed This Encounter  Procedures   TSH   Vitamin B12    Meds ordered this encounter  Medications   donepezil (ARICEPT) 5 MG tablet    Sig: Take 1 tablet (5 mg total) by mouth at bedtime.    Dispense:  30 tablet    Refill:  3    Return in about 1 year (around 03/05/2023).  I have spent a total of 65 minutes dedicated to this patient today, preparing to see patient, performing a medically appropriate examination and evaluation, ordering tests and/or medications and procedures, and counseling and educating the patient/family/caregiver; independently interpreting result and communicating results to the family/patient/caregiver; and documenting clinical information in the electronic medical record.   Alric Ran, MD 03/04/2022, 8:58 PM  [facility] [address], [place], [place] [address] [phone]

## 2022-03-04 NOTE — Patient Instructions (Addendum)
Start with Aricept 5 mg nightly  Will obtain Vitamin B12 and TSH today  Continue your other medications  Follow up in 1 year or sooner if worse, if there is increase behavioral changes

## 2022-03-05 ENCOUNTER — Ambulatory Visit: Payer: Medicare Other | Admitting: Hematology

## 2022-03-05 LAB — VITAMIN B12: Vitamin B-12: >2000 pg/mL — ABNORMAL HIGH (ref 232–1245)

## 2022-03-05 LAB — TSH: TSH: 1.39 u[IU]/mL (ref 0.450–4.500)

## 2022-03-05 NOTE — Progress Notes (Signed)
Please call and advise the patient  daughter that the recent labs we checked were within normal limits. We checked vitamin B12 level, and thyroid function. No further action is required on these tests at this time. Please remind patient to keep any upcoming appointments or tests and to call us with any interim questions, concerns, problems or updates. Thanks,   Alric Ran, MD

## 2022-03-12 ENCOUNTER — Inpatient Hospital Stay: Payer: Medicare Other | Attending: Hematology | Admitting: Hematology

## 2022-03-12 VITALS — BP 137/73 | HR 54 | Temp 97.6°F | Resp 18

## 2022-03-12 DIAGNOSIS — Z803 Family history of malignant neoplasm of breast: Secondary | ICD-10-CM | POA: Diagnosis not present

## 2022-03-12 DIAGNOSIS — C50211 Malignant neoplasm of upper-inner quadrant of right female breast: Secondary | ICD-10-CM | POA: Diagnosis not present

## 2022-03-12 DIAGNOSIS — Z79811 Long term (current) use of aromatase inhibitors: Secondary | ICD-10-CM | POA: Insufficient documentation

## 2022-03-12 DIAGNOSIS — Z79899 Other long term (current) drug therapy: Secondary | ICD-10-CM | POA: Diagnosis not present

## 2022-03-12 DIAGNOSIS — Z170 Estrogen receptor positive status [ER+]: Secondary | ICD-10-CM | POA: Diagnosis present

## 2022-03-12 DIAGNOSIS — C50811 Malignant neoplasm of overlapping sites of right female breast: Secondary | ICD-10-CM | POA: Insufficient documentation

## 2022-03-12 DIAGNOSIS — I10 Essential (primary) hypertension: Secondary | ICD-10-CM | POA: Insufficient documentation

## 2022-03-12 DIAGNOSIS — M8580 Other specified disorders of bone density and structure, unspecified site: Secondary | ICD-10-CM | POA: Insufficient documentation

## 2022-03-12 NOTE — Patient Instructions (Signed)
[place] at [facility] Discharge Instructions   You were seen and examined today by Dr. Delton Coombes.  He reviewed the results of your lab work, You hemoglobin was slightly lower, but otherwise results are stable.   We will see you back in 6 months. We will repeat blood work and breast ultrasound prior to this visit.    Thank you for choosing [place] at [facility] to provide your oncology and hematology care.  To afford each patient quality time with our provider, please arrive at least 15 minutes before your scheduled appointment time.   If you have a lab appointment with the [facility] please come in thru the Main Entrance and check in at the main information desk.  You need to re-schedule your appointment should you arrive 10 or more minutes late.  We strive to give you quality time with our providers, and arriving late affects you and other patients whose appointments are after yours.  Also, if you no show three or more times for appointments you may be dismissed from the clinic at the providers discretion.     Again, thank you for choosing [facility].  Our hope is that these requests will decrease the amount of time that you wait before being seen by our physicians.       _____________________________________________________________  Should you have questions after your visit to [facility], please contact our office at [phone] and follow the prompts.  Our office hours are 8:00 a.m. and 4:30 p.m. Monday - Friday.  Please note that voicemails left after 4:00 p.m. may not be returned until the following business day.  We are closed weekends and major holidays.  You do have access to a nurse 24-7, just call the main number to the clinic [phone] and do not press any options, hold on the line and a nurse will answer the phone.    For prescription refill requests, have your pharmacy contact our office and  allow 72 hours.    Due to Covid, you will need to wear a mask upon entering the hospital. If you do not have a mask, a mask will be given to you at the Main Entrance upon arrival. For doctor visits, patients may have 1 support person age 44 or older with them. For treatment visits, patients can not have anyone with them due to social distancing guidelines and our immunocompromised population.

## 2022-03-12 NOTE — Progress Notes (Signed)
[facility] [address] [place] [address]   Patient Care Team: Vidal Schwalbe, MD as PCP - General (Family Medicine) Brien Mates, RN as Oncology Nurse Navigator (Oncology) Jason Coop, NP as Nurse Practitioner (Hospice and Palliative Medicine) Harrie Foreman, NP as Referring Physician (Adult Health Nurse Practitioner)  SUMMARY OF ONCOLOGIC HISTORY: Oncology History   No history exists.    CHIEF COMPLIANT: Follow-up for right breast cancer   INTERVAL HISTORY: Ms. Adriana Stevens is a 88 y.o. female here for follow-up of right-sided breast cancer.  She is tolerating anastrozole very well.  Denies any hot flashes.  She reports right shoulder pain which is chronic and mostly postural as she lies and leans on that side.  REVIEW OF SYSTEMS:   Review of Systems  Constitutional:  Negative for appetite change and fatigue.  Respiratory:  Positive for shortness of breath (Occasional).   Endocrine: Negative for hot flashes.  Musculoskeletal:  Negative for arthralgias.  All other systems reviewed and are negative.   I have reviewed the past medical history, past surgical history, social history and family history with the patient and they are unchanged from previous note.   ALLERGIES:   has No Known Allergies.   MEDICATIONS:  Current Outpatient Medications  Medication Sig Dispense Refill   alum & mag hydroxide-simeth (MAALOX/MYLANTA) 200-200-20 MG/5ML suspension Take 30 mLs by mouth every 6 (six) hours as needed for indigestion or heartburn.     Amino Acids-Protein Hydrolys (FEEDING SUPPLEMENT, PRO-STAT 64,) LIQD Take 30 mLs by mouth daily.     amLODipine (NORVASC) 10 MG tablet Take 10 mg by mouth daily.     anastrozole (ARIMIDEX) 1 MG tablet Take 1 tablet (1 mg total) by mouth daily. 30 tablet 6   Ascorbic Acid 500 MG/15ML LIQD 1 tablet     ASPERCREME LIDOCAINE 4 % PTCH Apply 1 patch topically daily.     cloNIDine (CATAPRES - DOSED  IN MG/24 HR) 0.3 mg/24hr patch Place 1 patch (0.3 mg total) onto the skin once a week. 4 patch 12   diclofenac Sodium (VOLTAREN) 1 % GEL See admin instructions.     docusate sodium (COLACE) 100 MG capsule Take 100 mg by mouth daily.     donepezil (ARICEPT) 5 MG tablet Take 1 tablet (5 mg total) by mouth at bedtime. 30 tablet 3   famotidine (PEPCID) 20 MG tablet Take 20 mg by mouth 2 (two) times daily.     gabapentin (NEURONTIN) 100 MG capsule Take 100 mg by mouth at bedtime.     hydroxypropyl methylcellulose / hypromellose (ISOPTO TEARS / GONIOVISC) 2.5 % ophthalmic solution 1 drop 4 (four) times daily.     latanoprost (XALATAN) 0.005 % ophthalmic solution SMARTSIG:In Eye(s)     loteprednol (LOTEMAX) 0.5 % ophthalmic suspension SMARTSIG:In Eye(s)     Magnesium 400 MG TABS Take 1 tablet by mouth at bedtime.     magnesium hydroxide (MILK OF MAGNESIA) 400 MG/5ML suspension Take 30 mLs by mouth daily as needed for mild constipation.     melatonin 3 MG TABS tablet Take 3 mg by mouth at bedtime.     Multiple Vitamin (TAB-A-VITE) TABS Take 1 tablet by mouth daily.     nitroGLYCERIN (NITROSTAT) 0.4 MG SL tablet Place 0.4 mg under the tongue every 5 (five) minutes as needed for chest pain.     olmesartan-hydrochlorothiazide (BENICAR HCT) 40-12.5 MG tablet Take 1 tablet by mouth daily.     potassium  chloride SA (KLOR-CON) 20 MEQ tablet Take 20 mEq by mouth daily.     senna (SENOKOT) 8.6 MG TABS tablet Take 2 tablets by mouth at bedtime.     sodium chloride (MURO 128) 5 % ophthalmic solution SMARTSIG:In Eye(s)     SYSTANE 0.4-0.3 % SOLN Apply to eye.     traMADol (ULTRAM-ER) 100 MG 24 hr tablet 1 tab(s) orally once a day for 30 day(s)     traZODone (DESYREL) 50 MG tablet 1 tab(s) orally 1 qhs for 90 days     labetalol (NORMODYNE) 200 MG tablet Take 1 tablet (200 mg total) by mouth 2 (two) times daily. 60 tablet 0   No current facility-administered medications for this visit.     PHYSICAL  EXAMINATION: Performance status (ECOG): 3 - Symptomatic, >50% confined to bed  Vitals:   03/12/22 1145  BP: 137/73  Pulse: (!) 54  Resp: 18  Temp: 97.6 F (36.4 C)  SpO2: 98%   Wt Readings from Last 3 Encounters:  01/20/22 165 lb (74.8 kg)  07/27/21 170 lb 6.4 oz (77.3 kg)  06/22/21 159 lb (72.1 kg)   Physical Exam Vitals reviewed.  Constitutional:      Appearance: Normal appearance.     Comments: In wheelchair  Cardiovascular:     Rate and Rhythm: Normal rate and regular rhythm.     Pulses: Normal pulses.     Heart sounds: Normal heart sounds.  Pulmonary:     Effort: Pulmonary effort is normal.     Breath sounds: Normal breath sounds.  Chest:  Breasts:    Right: Mass (2 cm nodule @ 12:00) present. No swelling, bleeding, inverted nipple, nipple discharge, skin change or tenderness.  Lymphadenopathy:     Upper Body:     Right upper body: No supraclavicular or axillary adenopathy.  Neurological:     General: No focal deficit present.     Mental Status: She is alert and oriented to person, place, and time.  Psychiatric:        Mood and Affect: Mood normal.        Behavior: Behavior normal.     Breast Exam Chaperone: Thana Ates     LABORATORY DATA:  I have reviewed the data as listed    Latest Ref Rng & Units 02/26/2022   10:44 AM 01/24/2022    3:16 AM 01/23/2022    4:34 AM  CMP  Glucose 70 - 99 mg/dL 133  107  102   BUN 8 - 23 mg/dL 39  23  20   Creatinine 0.44 - 1.00 mg/dL 1.30  0.97  1.15   Sodium 135 - 145 mmol/L 140  141  139   Potassium 3.5 - 5.1 mmol/L 4.3  3.2  3.2   Chloride 98 - 111 mmol/L 106  113  110   CO2 22 - 32 mmol/L _0 Calcium 8.9 - 10.3 mg/dL 8.9  8.2  7.8   Total Protein 6.5 - 8.1 g/dL 6.7     Total Bilirubin 0.3 - 1.2 mg/dL 0.6     Alkaline Phos 38 - 126 U/L 79     AST 15 - 41 U/L 16     ALT 0 - 44 U/L 12      No results found for: "CAN153" Lab Results  Component Value Date   WBC 9.3 02/26/2022   HGB 9.5 (L)  02/26/2022   HCT 30.6 (L) 02/26/2022   MCV 95.0 02/26/2022  PLT 222 02/26/2022   NEUTROABS 7.4 02/26/2022    ASSESSMENT:  1.  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  Additional suspicious mass with calcifications in the inner right breast measuring up to 2.3 cm. 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 -She does not want to have any surgical intervention. - Anastrozole started on 08/01/2020.   2.  Social/family history: -She lives at assisted living facility "[facility]".  She uses wheelchair for ambulation and is able to dress herself and feed herself. 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  Never smoker. -Twin sister had cancer.  Brother had cancer.  Another sister had breast cancer.   3.  Bone health: - DEXA scan on 08/31/2020 T score -1.8, osteopenia.   PLAN:  1.  Stage I (T2 N0 M0) right breast IDC, ER/PR positive, HER-2 negative: - Physical exam today shows 2 cm mass in the right breast 12 o'clock position. -Ultrasound breast (02/26/2022): Dominant mass at the right breast measures 2 x 0.7 x 2 cm.  Additional mass in the right breast at the 2:30 position measures 0.2 x 0.3 x 0.2 cm, mostly stable. - Recommend continue anastrozole. - RTC 6 months for follow-up with repeat right breast ultrasound including axilla. - She has elevated creatinine of 1.3 on labs on 02/26/2022.  She was recently hospitalized for infection.  I have recommended that creatinine be repeated by her PMD.   2.  Bone health: - Vitamin D is 33.  Continue calcium and vitamin D supplements.    Breast Cancer therapy associated bone loss: I have recommended calcium, Vitamin D and weight bearing exercises.  Orders placed this  encounter:  Orders Placed This Encounter  Procedures   US BREAST LTD UNI RIGHT INC AXILLA   CBC with Differential   Comprehensive metabolic panel   VITAMIN D 25 Hydroxy (Vit-D Deficiency, Fractures)     The patient has a good understanding of the overall plan. She agrees with it. She will call with any problems that may develop before the next visit here.  Derek Jack, MD [place] [phone]

## 2022-06-06 DEATH — deceased

## 2022-09-03 ENCOUNTER — Other Ambulatory Visit: Payer: Medicare Other

## 2022-09-03 ENCOUNTER — Other Ambulatory Visit (HOSPITAL_COMMUNITY): Payer: Medicare Other

## 2022-09-10 ENCOUNTER — Ambulatory Visit: Payer: Medicare Other | Admitting: Hematology

## 2022-09-17 ENCOUNTER — Ambulatory Visit: Payer: Medicare Other | Admitting: Hematology

## 2023-03-05 ENCOUNTER — Ambulatory Visit: Payer: Medicare Other | Admitting: Neurology
